# Patient Record
Sex: Male | Born: 1955 | Hispanic: No | Marital: Married | Smoking: Former smoker
Health system: Southern US, Community
[De-identification: ages and names within clinical notes are randomized; demographics above are authoritative.]

## PROBLEM LIST (undated history)

## (undated) DIAGNOSIS — G4730 Sleep apnea, unspecified: Secondary | ICD-10-CM

## (undated) DIAGNOSIS — M1990 Unspecified osteoarthritis, unspecified site: Secondary | ICD-10-CM

## (undated) DIAGNOSIS — J189 Pneumonia, unspecified organism: Secondary | ICD-10-CM

## (undated) DIAGNOSIS — J449 Chronic obstructive pulmonary disease, unspecified: Secondary | ICD-10-CM

---

## 2012-05-27 ENCOUNTER — Ambulatory Visit: Payer: Self-pay | Admitting: Specialist

## 2012-08-05 ENCOUNTER — Ambulatory Visit: Payer: Self-pay | Admitting: Specialist

## 2018-03-23 ENCOUNTER — Other Ambulatory Visit: Payer: Self-pay | Admitting: Orthopedic Surgery

## 2018-03-23 DIAGNOSIS — M545 Low back pain, unspecified: Secondary | ICD-10-CM

## 2018-04-05 ENCOUNTER — Ambulatory Visit
Admission: RE | Admit: 2018-04-05 | Discharge: 2018-04-05 | Disposition: A | Discharge status: Home / Self Care | Payer: BLUE CROSS/BLUE SHIELD | Source: Ambulatory Visit | Attending: Orthopedic Surgery | Admitting: Orthopedic Surgery

## 2018-04-05 DIAGNOSIS — M545 Low back pain, unspecified: Secondary | ICD-10-CM

## 2018-10-11 ENCOUNTER — Ambulatory Visit: Payer: Self-pay | Admitting: Orthopedic Surgery

## 2018-10-18 ENCOUNTER — Ambulatory Visit: Payer: Self-pay | Admitting: Orthopedic Surgery

## 2018-10-18 DIAGNOSIS — M12551 Traumatic arthropathy, right hip: Secondary | ICD-10-CM

## 2018-10-18 NOTE — H&P (Signed)
TOTAL HIP ADMISSION H&P  Patient is admitted for right total hip arthroplasty.  Subjective:  Chief Complaint: right hip pain  HPI: Brandon Kaufman, 63 y.o. male, has a history of pain and functional disability in the right hip(s) due to arthritis and patient has failed non-surgical conservative treatments for greater than 12 weeks to include NSAID's and/or analgesics, flexibility and strengthening excercises, use of assistive devices, weight reduction as appropriate and activity modification.  Onset of symptoms was gradual starting 5 years ago with gradually worsening course since that time.The patient noted prior procedures of the hip to include traction for proximal femur fracture on the right hip(s).  Patient currently rates pain in the right hip at 10 out of 10 with activity. Patient has night pain, worsening of pain with activity and weight bearing, trendelenberg gait, pain that interfers with activities of daily living, pain with passive range of motion, crepitus and joint swelling. Patient has evidence of subchondral cysts, subchondral sclerosis, periarticular osteophytes and joint space narrowing by imaging studies. This condition presents safety issues increasing the risk of falls. This patient has had proximal femur fracture.  There is no current active infection.  There are no active problems to display for this patient.  Past Medical History:  Diagnosis Date  . Arthritis   . COPD (chronic obstructive pulmonary disease) (HCC)   . Pneumonia   . Sleep apnea    cpap    Past Surgical History:  Procedure Laterality Date  . COLONOSCOPY      No current facility-administered medications for this visit.    No current outpatient medications on file.   Facility-Administered Medications Ordered in Other Visits  Medication Dose Route Frequency Provider Last Rate Last Dose  . 0.9 %  sodium chloride infusion   Intravenous Continuous Athea Haley, MD      . acetaminophen (OFIRMEV) IV 1,000  mg  1,000 mg Intravenous To OR Lluvia Gwynne, MD      . ceFAZolin (ANCEF) IVPB 2g/100 mL premix  2 g Intravenous On Call to OR Lucresha Dismuke, MD      . chlorhexidine (HIBICLENS) 4 % liquid 4 application  60 mL Topical Once Wisdom Seybold, MD      . chlorhexidine (HIBICLENS) 4 % liquid 4 application  60 mL Topical Once Damichael Hofman, MD      . lactated ringers infusion   Intravenous Continuous Ayden Apodaca, MD 10 mL/hr at 10/27/18 0607    . povidone-iodine 10 % swab 2 application  2 application Topical Once Shifra Swartzentruber, MD      . povidone-iodine 10 % swab 2 application  2 application Topical Once Yaire Kreher, MD      . tranexamic acid (CYKLOKAPRON) IVPB 1,000 mg  1,000 mg Intravenous To OR Minnie Legros, MD       No Known Allergies  Social History   Tobacco Use  . Smoking status: Former Smoker    Types: Cigarettes    Quit date: 10/24/2011    Years since quitting: 7.0  . Smokeless tobacco: Never Used  Substance Use Topics  . Alcohol use: Yes    Alcohol/week: 1.0 standard drinks    Types: 1 Cans of beer per week    Comment: can a day    No family history on file.   Review of Systems  Constitutional: Negative.   HENT: Negative.   Eyes: Negative.   Respiratory: Negative.   Cardiovascular: Negative.   Gastrointestinal: Negative.   Genitourinary: Negative.   Musculoskeletal: Positive for joint   pain.  Skin: Negative.   Neurological: Negative.   Endo/Heme/Allergies: Negative.   Psychiatric/Behavioral: Negative.     Objective:  Physical Exam  Vitals reviewed. Constitutional: He is oriented to person, place, and time. He appears well-developed and well-nourished.  HENT:  Head: Normocephalic and atraumatic.  Eyes: Pupils are equal, round, and reactive to light. Conjunctivae and EOM are normal.  Neck: Normal range of motion. Neck supple.  Cardiovascular: Normal rate, regular rhythm and intact distal pulses.  Respiratory: Effort normal. No respiratory distress.   GI: Soft. He exhibits no distension.  Genitourinary:    Genitourinary Comments: Deferred   Musculoskeletal:     Right hip: He exhibits decreased range of motion, decreased strength and bony tenderness.  Neurological: He is alert and oriented to person, place, and time. He has normal reflexes.  Skin: Skin is warm and dry.  Psychiatric: He has a normal mood and affect. His behavior is normal. Judgment and thought content normal.    Vital signs in last 24 hours: @VSRANGES @  Labs:   Estimated body mass index is 29.97 kg/m as calculated from the following:   Height as of 10/27/18: 6\' 4"  (1.93 m).   Weight as of 10/27/18: 111.7 kg.   Imaging Review Plain radiographs demonstrate severe degenerative joint disease of the right hip(s). The bone quality appears to be adequate for age and reported activity level.      Assessment/Plan:  End stage arthritis, right hip(s)  The patient history, physical examination, clinical judgement of the provider and imaging studies are consistent with end stage degenerative joint disease of the right hip(s) and total hip arthroplasty is deemed medically necessary. The treatment options including medical management, injection therapy, arthroscopy and arthroplasty were discussed at length. The risks and benefits of total hip arthroplasty were presented and reviewed. The risks due to aseptic loosening, infection, stiffness, dislocation/subluxation,  thromboembolic complications and other imponderables were discussed.  The patient acknowledged the explanation, agreed to proceed with the plan and consent was signed. Patient is being admitted for inpatient treatment for surgery, pain control, PT, OT, prophylactic antibiotics, VTE prophylaxis, progressive ambulation and ADL's and discharge planning.The patient is planning to be discharged home with HEP   Anticipated LOS equal to or greater than 2 midnights due to - Age 34 and older with one or more of the  following:  - Obesity  - Expected need for hospital services (PT, OT, Nursing) required for safe  discharge  - Anticipated need for postoperative skilled nursing care or inpatient rehab  - Active co-morbidities: Respiratory Failure/COPD OR   - Unanticipated findings during/Post Surgery: None  - Patient is a high risk of re-admission due to: None

## 2018-10-18 NOTE — H&P (View-Only) (Signed)
TOTAL HIP ADMISSION H&P  Patient is admitted for right total hip arthroplasty.  Subjective:  Chief Complaint: right hip pain  HPI: Brandon Kaufman, 63 y.o. male, has a history of pain and functional disability in the right hip(s) due to arthritis and patient has failed non-surgical conservative treatments for greater than 12 weeks to include NSAID's and/or analgesics, flexibility and strengthening excercises, use of assistive devices, weight reduction as appropriate and activity modification.  Onset of symptoms was gradual starting 5 years ago with gradually worsening course since that time.The patient noted prior procedures of the hip to include traction for proximal femur fracture on the right hip(s).  Patient currently rates pain in the right hip at 10 out of 10 with activity. Patient has night pain, worsening of pain with activity and weight bearing, trendelenberg gait, pain that interfers with activities of daily living, pain with passive range of motion, crepitus and joint swelling. Patient has evidence of subchondral cysts, subchondral sclerosis, periarticular osteophytes and joint space narrowing by imaging studies. This condition presents safety issues increasing the risk of falls. This patient has had proximal femur fracture.  There is no current active infection.  There are no active problems to display for this patient.  Past Medical History:  Diagnosis Date  . Arthritis   . COPD (chronic obstructive pulmonary disease) (HCC)   . Pneumonia   . Sleep apnea    cpap    Past Surgical History:  Procedure Laterality Date  . COLONOSCOPY      No current facility-administered medications for this visit.    No current outpatient medications on file.   Facility-Administered Medications Ordered in Other Visits  Medication Dose Route Frequency Provider Last Rate Last Dose  . 0.9 %  sodium chloride infusion   Intravenous Continuous Jessa Stinson, Arlys JohnBrian, MD      . acetaminophen (OFIRMEV) IV 1,000  mg  1,000 mg Intravenous To OR Adriyana Greenbaum, Arlys JohnBrian, MD      . ceFAZolin (ANCEF) IVPB 2g/100 mL premix  2 g Intravenous On Call to OR Lalaine Overstreet, Arlys JohnBrian, MD      . chlorhexidine (HIBICLENS) 4 % liquid 4 application  60 mL Topical Once Earlyne Feeser, Arlys JohnBrian, MD      . chlorhexidine (HIBICLENS) 4 % liquid 4 application  60 mL Topical Once Samson FredericSwinteck, Brion Hedges, MD      . lactated ringers infusion   Intravenous Continuous Samson FredericSwinteck, Shayle Donahoo, MD 10 mL/hr at 10/27/18 [phone]    . povidone-iodine 10 % swab 2 application  2 application Topical Once Arlind Klingerman, Arlys JohnBrian, MD      . povidone-iodine 10 % swab 2 application  2 application Topical Once Patty Leitzke, Arlys JohnBrian, MD      . tranexamic acid (CYKLOKAPRON) IVPB 1,000 mg  1,000 mg Intravenous To OR Mayfield Schoene, Arlys JohnBrian, MD       No Known Allergies  Social History   Tobacco Use  . Smoking status: Former Smoker    Types: Cigarettes    Quit date: 10/24/2011    Years since quitting: 7.0  . Smokeless tobacco: Never Used  Substance Use Topics  . Alcohol use: Yes    Alcohol/week: 1.0 standard drinks    Types: 1 Cans of beer per week    Comment: can a day    No family history on file.   Review of Systems  Constitutional: Negative.   HENT: Negative.   Eyes: Negative.   Respiratory: Negative.   Cardiovascular: Negative.   Gastrointestinal: Negative.   Genitourinary: Negative.   Musculoskeletal: Positive for joint  pain.  Skin: Negative.   Neurological: Negative.   Endo/Heme/Allergies: Negative.   Psychiatric/Behavioral: Negative.     Objective:  Physical Exam  Vitals reviewed. Constitutional: He is oriented to person, place, and time. He appears well-developed and well-nourished.  HENT:  Head: Normocephalic and atraumatic.  Eyes: Pupils are equal, round, and reactive to light. Conjunctivae and EOM are normal.  Neck: Normal range of motion. Neck supple.  Cardiovascular: Normal rate, regular rhythm and intact distal pulses.  Respiratory: Effort normal. No respiratory distress.   GI: Soft. He exhibits no distension.  Genitourinary:    Genitourinary Comments: Deferred   Musculoskeletal:     Right hip: He exhibits decreased range of motion, decreased strength and bony tenderness.  Neurological: He is alert and oriented to person, place, and time. He has normal reflexes.  Skin: Skin is warm and dry.  Psychiatric: He has a normal mood and affect. His behavior is normal. Judgment and thought content normal.    Vital signs in last 24 hours: @VSRANGES @  Labs:   Estimated body mass index is 29.97 kg/m as calculated from the following:   Height as of 10/27/18: 6\' 4"  (1.93 m).   Weight as of 10/27/18: 111.7 kg.   Imaging Review Plain radiographs demonstrate severe degenerative joint disease of the right hip(s). The bone quality appears to be adequate for age and reported activity level.      Assessment/Plan:  End stage arthritis, right hip(s)  The patient history, physical examination, clinical judgement of the provider and imaging studies are consistent with end stage degenerative joint disease of the right hip(s) and total hip arthroplasty is deemed medically necessary. The treatment options including medical management, injection therapy, arthroscopy and arthroplasty were discussed at length. The risks and benefits of total hip arthroplasty were presented and reviewed. The risks due to aseptic loosening, infection, stiffness, dislocation/subluxation,  thromboembolic complications and other imponderables were discussed.  The patient acknowledged the explanation, agreed to proceed with the plan and consent was signed. Patient is being admitted for inpatient treatment for surgery, pain control, PT, OT, prophylactic antibiotics, VTE prophylaxis, progressive ambulation and ADL's and discharge planning.The patient is planning to be discharged home with HEP   Anticipated LOS equal to or greater than 2 midnights due to - Age 34 and older with one or more of the  following:  - Obesity  - Expected need for hospital services (PT, OT, Nursing) required for safe  discharge  - Anticipated need for postoperative skilled nursing care or inpatient rehab  - Active co-morbidities: Respiratory Failure/COPD OR   - Unanticipated findings during/Post Surgery: None  - Patient is a high risk of re-admission due to: None

## 2018-10-20 NOTE — Patient Instructions (Addendum)
YOU NEED TO HAVE A COVID 19 TEST ON_______ @_______ , THIS TEST MUST BE DONE BEFORE SURGERY, COME TO [place] ENTRANCE. ONCE YOUR COVID TEST IS COMPLETED, PLEASE BEGIN THE QUARANTINE INSTRUCTIONS AS OUTLINED IN YOUR HANDOUT.                Brandon Kaufman    Your procedure is scheduled on: 10-27-2018  Report to [facility] Main  Entrance  Report to [place] at 530 AM      Call this number if you have problems the morning of surgery [phone]    Remember: [place], NO [place].   NO SOLID FOOD AFTER MIDNIGHT THE NIGHT PRIOR TO SURGERY. NOTHING BY MOUTH EXCEPT CLEAR LIQUIDS UNTIL 430 AM.  PLEASE FINISH ENSURE DRINK PER SURGEON ORDER 3 HOURS PRIOR TO SCHEDULED SURGERY TIME WHICH NEEDS TO BE COMPLETED AT 430 AM.   CLEAR LIQUID DIET   Foods Allowed                                                                     Foods Excluded  Coffee and tea, regular and decaf                             liquids that you cannot  Plain Jell-O in any flavor                                             see through such as: Fruit ices (not with fruit pulp)                                     milk, soups, orange juice  Iced Popsicles                                    All solid food Carbonated beverages, regular and diet                                    Cranberry, grape and apple juices Sports drinks like Gatorade Lightly seasoned clear broth or consume(fat free) Sugar, honey syrup  Sample Menu Breakfast                                Lunch                                     Supper Cranberry juice                    Beef broth  Chicken broth Jell-O                                     Grape juice                           Apple juice Coffee or tea                        Jell-O                                      Popsicle    Coffee or tea                        Coffee or tea  _____________________________________________________________________     Take these medicines the morning of surgery with A SIP OF WATER: inhaler and bring with you lipitor, claritin                                You may not have any metal on your body including hair pins and              piercings  Do not wear jewelry,lotions, powders or perfumes, deodorant                        Men may shave face and neck.   Do not bring valuables to the hospital. [facility] IS NOT             RESPONSIBLE   FOR VALUABLES.  Contacts, dentures or bridgework may not be worn into surgery.      _____________________________________________________________________             [facility] Before surgery, you can play an important role.  Because skin is not sterile, your skin needs to be as free of germs as possible.  You can reduce the number of germs on your skin by washing with CHG (chlorahexidine gluconate) soap before surgery.  CHG is an antiseptic cleaner which kills germs and bonds with the skin to continue killing germs even after washing. Please DO NOT use if you have an allergy to CHG or antibacterial soaps.  If your skin becomes reddened/irritated stop using the CHG and inform your nurse when you arrive at Short Stay. Do not shave (including legs and underarms) for at least 48 hours prior to the first CHG shower.  You may shave your face/neck. Please follow these instructions carefully:  1.  Shower with CHG Soap the night before surgery and the  morning of Surgery.  2.  If you choose to wash your hair, wash your hair first as usual with your  normal  shampoo.  3.  After you shampoo, rinse your hair and body thoroughly to remove the  shampoo.                           4.  Use CHG as you would any other liquid soap.  You can apply chg directly  to the skin and wash  Gently with a scrungie or clean  washcloth.  5.  Apply the CHG Soap to your body ONLY FROM THE NECK DOWN.   Do not use on face/ open                           Wound or open sores. Avoid contact with eyes, ears mouth and genitals (private parts).                       Wash face,  Genitals (private parts) with your normal soap.             6.  Wash thoroughly, paying special attention to the area where your surgery  will be performed.  7.  Thoroughly rinse your body with warm water from the neck down.  8.  DO NOT shower/wash with your normal soap after using and rinsing off  the CHG Soap.                9.  Pat yourself dry with a clean towel.            10.  Wear clean pajamas.            11.  Place clean sheets on your bed the night of your first shower and do not  sleep with pets. Day of Surgery : Do not apply any lotions/deodorants the morning of surgery.  Please wear clean clothes to the hospital/surgery center.  FAILURE TO FOLLOW THESE INSTRUCTIONS MAY RESULT IN THE CANCELLATION OF YOUR SURGERY PATIENT SIGNATURE_________________________________  NURSE SIGNATURE__________________________________  ________________________________________________________________________   [name]  An incentive spirometer is a tool that can help keep your lungs clear and active. This tool measures how well you are filling your lungs with each breath. Taking long deep breaths may help reverse or decrease the chance of developing breathing (pulmonary) problems (especially infection) following:  A long period of time when you are unable to move or be active. BEFORE THE PROCEDURE   If the spirometer includes an indicator to show your best effort, your nurse or respiratory therapist will set it to a desired goal.  If possible, sit up straight or lean slightly forward. Try not to slouch.  Hold the incentive spirometer in an upright position. INSTRUCTIONS FOR USE  1. Sit on the edge of your bed if possible, or sit up as far  as you can in bed or on a chair. 2. Hold the incentive spirometer in an upright position. 3. Breathe out normally. 4. Place the mouthpiece in your mouth and seal your lips tightly around it. 5. Breathe in slowly and as deeply as possible, raising the piston or the ball toward the top of the column. 6. Hold your breath for 3-5 seconds or for as long as possible. Allow the piston or ball to fall to the bottom of the column. 7. Remove the mouthpiece from your mouth and breathe out normally. 8. Rest for a few seconds and repeat Steps 1 through 7 at least 10 times every 1-2 hours when you are awake. Take your time and take a few normal breaths between deep breaths. 9. The spirometer may include an indicator to show your best effort. Use the indicator as a goal to work toward during each repetition. 10. After each set of 10 deep breaths, practice coughing to be sure your lungs are clear. If you have an incision (the cut made at the time of  surgery), support your incision when coughing by placing a pillow or rolled up towels firmly against it. Once you are able to get out of bed, walk around indoors and cough well. You may stop using the incentive spirometer when instructed by your caregiver.  RISKS AND COMPLICATIONS  Take your time so you do not get dizzy or light-headed.  If you are in pain, you may need to take or ask for pain medication before doing incentive spirometry. It is harder to take a deep breath if you are having pain. AFTER USE  Rest and breathe slowly and easily.  It can be helpful to keep track of a log of your progress. Your caregiver can provide you with a simple table to help with this. If you are using the spirometer at home, follow these instructions: [place] IF:   You are having difficultly using the spirometer.  You have trouble using the spirometer as often as instructed.  Your pain medication is not giving enough relief while using the spirometer.  You  develop fever of 100.5 F (38.1 C) or higher. SEEK IMMEDIATE MEDICAL CARE IF:   You cough up bloody sputum that had not been present before.  You develop fever of 102 F (38.9 C) or greater.  You develop worsening pain at or near the incision site. MAKE SURE YOU:   Understand these instructions.  Will watch your condition.  Will get help right away if you are not doing well or get worse. Document Released: 08/17/2006 Document Revised: 06/29/2011 Document Reviewed: 10/18/2006 ExitCare Patient Information 2014 Exit[employer], [place].   ________________________________________________________________________  WHAT IS A BLOOD TRANSFUSION? Blood Transfusion Information  A transfusion is the replacement of blood or some of its parts. Blood is made up of multiple cells which provide different functions.  Red blood cells carry oxygen and are used for blood loss replacement.  White blood cells fight against infection.  Platelets control bleeding.  Plasma helps clot blood.  Other blood products are available for specialized needs, such as hemophilia or other clotting disorders. BEFORE THE TRANSFUSION  Who gives blood for transfusions?   Healthy volunteers who are fully evaluated to make sure their blood is safe. This is blood bank blood. Transfusion therapy is the safest it has ever been in the practice of medicine. Before blood is taken from a donor, a complete history is taken to make sure that person has no history of diseases nor engages in risky social behavior (examples are intravenous drug use or sexual activity with multiple partners). The donor's travel history is screened to minimize risk of transmitting infections, such as malaria. The donated blood is tested for signs of infectious diseases, such as HIV and hepatitis. The blood is then tested to be sure it is compatible with you in order to minimize the chance of a transfusion reaction. If you or a relative donates blood, this is  often done in anticipation of surgery and is not appropriate for emergency situations. It takes many days to process the donated blood. RISKS AND COMPLICATIONS Although transfusion therapy is very safe and saves many lives, the main dangers of transfusion include:   Getting an infectious disease.  Developing a transfusion reaction. This is an allergic reaction to something in the blood you were given. Every precaution is taken to prevent this. The decision to have a blood transfusion has been considered carefully by your caregiver before blood is given. Blood is not given unless the benefits outweigh the risks. AFTER THE  TRANSFUSION  Right after receiving a blood transfusion, you will usually feel much better and more energetic. This is especially true if your red blood cells have gotten low (anemic). The transfusion raises the level of the red blood cells which carry oxygen, and this usually causes an energy increase.  The nurse administering the transfusion will monitor you carefully for complications. HOME CARE INSTRUCTIONS  No special instructions are needed after a transfusion. You may find your energy is better. Speak with your caregiver about any limitations on activity for underlying diseases you may have. SEEK MEDICAL CARE IF:   Your condition is not improving after your transfusion.  You develop redness or irritation at the intravenous (IV) site. SEEK IMMEDIATE MEDICAL CARE IF:  Any of the following symptoms occur over the next 12 hours:  Shaking chills.  You have a temperature by mouth above 102 F (38.9 C), not controlled by medicine.  Chest, back, or muscle pain.  People around you feel you are not acting correctly or are confused.  Shortness of breath or difficulty breathing.  Dizziness and fainting.  You get a rash or develop hives.  You have a decrease in urine output.  Your urine turns a dark color or changes to pink, red, or brown. Any of the following  symptoms occur over the next 10 days:  You have a temperature by mouth above 102 F (38.9 C), not controlled by medicine.  Shortness of breath.  Weakness after normal activity.  The white part of the eye turns yellow (jaundice).  You have a decrease in the amount of urine or are urinating less often.  Your urine turns a dark color or changes to pink, red, or brown. Document Released: 04/03/2000 Document Revised: 06/29/2011 Document Reviewed: 11/21/2007 [facility] Patient Information 2014 [place], [place].  _______________________________________________________________________

## 2018-10-24 ENCOUNTER — Encounter (HOSPITAL_COMMUNITY)
Admission: RE | Admit: 2018-10-24 | Discharge: 2018-10-24 | Disposition: A | Discharge status: Home / Self Care | Payer: BC Managed Care – PPO | Source: Ambulatory Visit | Attending: Orthopedic Surgery | Admitting: Orthopedic Surgery

## 2018-10-24 ENCOUNTER — Other Ambulatory Visit (HOSPITAL_COMMUNITY)
Admission: RE | Admit: 2018-10-24 | Discharge: 2018-10-24 | Disposition: A | Discharge status: Home / Self Care | Payer: BC Managed Care – PPO | Source: Ambulatory Visit | Attending: Orthopedic Surgery | Admitting: Orthopedic Surgery

## 2018-10-24 ENCOUNTER — Other Ambulatory Visit: Payer: Self-pay

## 2018-10-24 ENCOUNTER — Encounter (HOSPITAL_COMMUNITY): Payer: Self-pay

## 2018-10-24 DIAGNOSIS — Z01812 Encounter for preprocedural laboratory examination: Secondary | ICD-10-CM | POA: Diagnosis present

## 2018-10-24 DIAGNOSIS — M1611 Unilateral primary osteoarthritis, right hip: Secondary | ICD-10-CM | POA: Insufficient documentation

## 2018-10-24 DIAGNOSIS — Z1159 Encounter for screening for other viral diseases: Secondary | ICD-10-CM | POA: Insufficient documentation

## 2018-10-24 HISTORY — DX: Chronic obstructive pulmonary disease, unspecified: J44.9

## 2018-10-24 HISTORY — DX: Pneumonia, unspecified organism: J18.9

## 2018-10-24 HISTORY — DX: Unspecified osteoarthritis, unspecified site: M19.90

## 2018-10-24 HISTORY — DX: Sleep apnea, unspecified: G47.30

## 2018-10-24 LAB — URINALYSIS, ROUTINE W REFLEX MICROSCOPIC
Bilirubin Urine: NEGATIVE
Glucose, UA: NEGATIVE mg/dL
Hgb urine dipstick: NEGATIVE
Ketones, ur: NEGATIVE mg/dL
Leukocytes,Ua: NEGATIVE
Nitrite: NEGATIVE
Protein, ur: NEGATIVE mg/dL
Specific Gravity, Urine: 1.014 (ref 1.005–1.030)
pH: 7 (ref 5.0–8.0)

## 2018-10-24 LAB — CBC
HCT: 47 % (ref 39.0–52.0)
Hemoglobin: 15.5 g/dL (ref 13.0–17.0)
MCH: 31.4 pg (ref 26.0–34.0)
MCHC: 33 g/dL (ref 30.0–36.0)
MCV: 95.1 fL (ref 80.0–100.0)
Platelets: 148 10*3/uL — ABNORMAL LOW (ref 150–400)
RBC: 4.94 MIL/uL (ref 4.22–5.81)
RDW: 12.7 % (ref 11.5–15.5)
WBC: 5.4 10*3/uL (ref 4.0–10.5)
nRBC: 0 % (ref 0.0–0.2)

## 2018-10-24 LAB — PROTIME-INR
INR: 1 (ref 0.8–1.2)
Prothrombin Time: 13.1 seconds (ref 11.4–15.2)

## 2018-10-24 LAB — COMPREHENSIVE METABOLIC PANEL
ALT: 38 U/L (ref 0–44)
AST: 28 U/L (ref 15–41)
Albumin: 4.4 g/dL (ref 3.5–5.0)
Alkaline Phosphatase: 56 U/L (ref 38–126)
Anion gap: 10 (ref 5–15)
BUN: 11 mg/dL (ref 8–23)
CO2: 24 mmol/L (ref 22–32)
Calcium: 9 mg/dL (ref 8.9–10.3)
Chloride: 105 mmol/L (ref 98–111)
Creatinine, Ser: 0.78 mg/dL (ref 0.61–1.24)
GFR calc Af Amer: >60 mL/min (ref >60)
GFR calc non Af Amer: >60 mL/min (ref >60)
Glucose, Bld: 99 mg/dL (ref 70–99)
Potassium: 4 mmol/L (ref 3.5–5.1)
Sodium: 139 mmol/L (ref 135–145)
Total Bilirubin: 1 mg/dL (ref 0.3–1.2)
Total Protein: 7.2 g/dL (ref 6.5–8.1)

## 2018-10-24 LAB — ABO/RH: ABO/RH(D): O POS

## 2018-10-24 LAB — SURGICAL PCR SCREEN
MRSA, PCR: NEGATIVE
Staphylococcus aureus: NEGATIVE

## 2018-10-24 LAB — APTT: aPTT: 27 seconds (ref 24–36)

## 2018-10-24 NOTE — Progress Notes (Deleted)
LOV Dr. Melvyn Novas 02-19-17 pulmonary  Pulm Function test 02-07-17   cxr 02-07-18

## 2018-10-25 LAB — SARS CORONAVIRUS 2 (TAT 6-24 HRS): SARS Coronavirus 2: NEGATIVE

## 2018-10-27 ENCOUNTER — Observation Stay (HOSPITAL_COMMUNITY): Payer: BC Managed Care – PPO

## 2018-10-27 ENCOUNTER — Encounter (HOSPITAL_COMMUNITY): Payer: Self-pay

## 2018-10-27 ENCOUNTER — Ambulatory Visit (HOSPITAL_COMMUNITY): Payer: BC Managed Care – PPO

## 2018-10-27 ENCOUNTER — Observation Stay (HOSPITAL_COMMUNITY)
Admission: RE | Admit: 2018-10-27 | Discharge: 2018-10-28 | Disposition: A | Discharge status: Home / Self Care | Payer: BC Managed Care – PPO | Source: Other Acute Inpatient Hospital | Attending: Orthopedic Surgery | Admitting: Orthopedic Surgery

## 2018-10-27 ENCOUNTER — Encounter (HOSPITAL_COMMUNITY)
Admission: RE | Disposition: A | Discharge status: Home / Self Care | Payer: Self-pay | Source: Other Acute Inpatient Hospital | Attending: Orthopedic Surgery

## 2018-10-27 ENCOUNTER — Other Ambulatory Visit: Payer: Self-pay

## 2018-10-27 ENCOUNTER — Ambulatory Visit (HOSPITAL_COMMUNITY): Payer: BC Managed Care – PPO | Admitting: Certified Registered"

## 2018-10-27 DIAGNOSIS — Z79899 Other long term (current) drug therapy: Secondary | ICD-10-CM | POA: Diagnosis not present

## 2018-10-27 DIAGNOSIS — R269 Unspecified abnormalities of gait and mobility: Secondary | ICD-10-CM | POA: Insufficient documentation

## 2018-10-27 DIAGNOSIS — J449 Chronic obstructive pulmonary disease, unspecified: Secondary | ICD-10-CM | POA: Insufficient documentation

## 2018-10-27 DIAGNOSIS — G4730 Sleep apnea, unspecified: Secondary | ICD-10-CM | POA: Insufficient documentation

## 2018-10-27 DIAGNOSIS — E669 Obesity, unspecified: Secondary | ICD-10-CM | POA: Insufficient documentation

## 2018-10-27 DIAGNOSIS — M12551 Traumatic arthropathy, right hip: Secondary | ICD-10-CM | POA: Diagnosis not present

## 2018-10-27 DIAGNOSIS — Z87891 Personal history of nicotine dependence: Secondary | ICD-10-CM | POA: Insufficient documentation

## 2018-10-27 DIAGNOSIS — Z6829 Body mass index (BMI) 29.0-29.9, adult: Secondary | ICD-10-CM | POA: Diagnosis not present

## 2018-10-27 DIAGNOSIS — Z09 Encounter for follow-up examination after completed treatment for conditions other than malignant neoplasm: Secondary | ICD-10-CM

## 2018-10-27 DIAGNOSIS — Z419 Encounter for procedure for purposes other than remedying health state, unspecified: Secondary | ICD-10-CM

## 2018-10-27 HISTORY — PX: TOTAL HIP ARTHROPLASTY: SHX124

## 2018-10-27 LAB — TYPE AND SCREEN
ABO/RH(D): O POS
Antibody Screen: NEGATIVE

## 2018-10-27 SURGERY — ARTHROPLASTY, HIP, TOTAL, ANTERIOR APPROACH
Anesthesia: Spinal | Site: Hip | Laterality: Right

## 2018-10-27 MED ORDER — ALBUTEROL SULFATE (2.5 MG/3ML) 0.083% IN NEBU: 2.5000 mg | INHALATION_SOLUTION | Freq: Four times a day (QID) | RESPIRATORY_TRACT | Status: DC | PRN

## 2018-10-27 MED ORDER — TRANEXAMIC ACID-NACL 1000-0.7 MG/100ML-% IV SOLN
1000.0000 mg | INTRAVENOUS | Status: AC
  Administered 2018-10-27: 08:00:00 1000 mg via INTRAVENOUS
  Filled 2018-10-27: qty 100

## 2018-10-27 MED ORDER — LORATADINE 10 MG PO TABS
10.0000 mg | ORAL_TABLET | Freq: Every day | ORAL | Status: DC
  Administered 2018-10-28: 10 mg via ORAL
  Filled 2018-10-27: qty 1

## 2018-10-27 MED ORDER — LATANOPROST 0.005 % OP SOLN
1.0000 [drp] | Freq: Every day | OPHTHALMIC | Status: DC
  Filled 2018-10-27: qty 2.5

## 2018-10-27 MED ORDER — PROPOFOL 10 MG/ML IV BOLUS
INTRAVENOUS | Status: AC
  Filled 2018-10-27: qty 20

## 2018-10-27 MED ORDER — CHLORHEXIDINE GLUCONATE 4 % EX LIQD: 60.0000 mL | Freq: Once | CUTANEOUS | Status: DC

## 2018-10-27 MED ORDER — DOCUSATE SODIUM 100 MG PO CAPS
100.0000 mg | ORAL_CAPSULE | Freq: Two times a day (BID) | ORAL | Status: DC
  Administered 2018-10-27 – 2018-10-28 (×2): 100 mg via ORAL
  Filled 2018-10-27 (×2): qty 1

## 2018-10-27 MED ORDER — BUPIVACAINE-EPINEPHRINE 0.25% -1:200000 IJ SOLN
INTRAMUSCULAR | Status: DC | PRN
  Administered 2018-10-27: 30 mL

## 2018-10-27 MED ORDER — ATORVASTATIN CALCIUM 40 MG PO TABS
40.0000 mg | ORAL_TABLET | Freq: Every day | ORAL | Status: DC
  Administered 2018-10-27: 40 mg via ORAL
  Filled 2018-10-27: qty 1

## 2018-10-27 MED ORDER — SODIUM CHLORIDE 0.9 % IV SOLN
INTRAVENOUS | Status: DC
  Administered 2018-10-27: 16:00:00 via INTRAVENOUS

## 2018-10-27 MED ORDER — GLYCOPYRROLATE PF 0.2 MG/ML IJ SOSY
PREFILLED_SYRINGE | INTRAMUSCULAR | Status: AC
  Filled 2018-10-27: qty 2

## 2018-10-27 MED ORDER — HYDROCODONE-ACETAMINOPHEN 5-325 MG PO TABS
ORAL_TABLET | ORAL | Status: AC
  Filled 2018-10-27: qty 1

## 2018-10-27 MED ORDER — CELECOXIB 200 MG PO CAPS
200.0000 mg | ORAL_CAPSULE | Freq: Two times a day (BID) | ORAL | Status: DC
  Administered 2018-10-27 – 2018-10-28 (×2): 200 mg via ORAL
  Filled 2018-10-27 (×2): qty 1

## 2018-10-27 MED ORDER — PROMETHAZINE HCL 25 MG/ML IJ SOLN: 6.2500 mg | INTRAMUSCULAR | Status: DC | PRN

## 2018-10-27 MED ORDER — SODIUM CHLORIDE 0.9 % IR SOLN
Status: DC | PRN
  Administered 2018-10-27: 1000 mL
  Administered 2018-10-27: 3000 mL

## 2018-10-27 MED ORDER — ISOPROPYL ALCOHOL 70 % SOLN
Status: DC | PRN
  Administered 2018-10-27: 1 via TOPICAL

## 2018-10-27 MED ORDER — HYDROCODONE-ACETAMINOPHEN 7.5-325 MG PO TABS: 1.0000 | ORAL_TABLET | ORAL | Status: DC | PRN

## 2018-10-27 MED ORDER — POVIDONE-IODINE 10 % EX SWAB: 2.0000 "application " | Freq: Once | CUTANEOUS | Status: DC

## 2018-10-27 MED ORDER — PHENOL 1.4 % MT LIQD: 1.0000 | OROMUCOSAL | Status: DC | PRN

## 2018-10-27 MED ORDER — KETOROLAC TROMETHAMINE 30 MG/ML IJ SOLN
INTRAMUSCULAR | Status: DC | PRN
  Administered 2018-10-27: 30 mg via INTRAVENOUS

## 2018-10-27 MED ORDER — MORPHINE SULFATE (PF) 2 MG/ML IV SOLN: 0.5000 mg | INTRAVENOUS | Status: DC | PRN

## 2018-10-27 MED ORDER — PROPOFOL 10 MG/ML IV BOLUS
INTRAVENOUS | Status: DC | PRN
  Administered 2018-10-27: 10 mg via INTRAVENOUS

## 2018-10-27 MED ORDER — KETOROLAC TROMETHAMINE 30 MG/ML IJ SOLN
INTRAMUSCULAR | Status: AC
  Filled 2018-10-27: qty 1

## 2018-10-27 MED ORDER — PROPOFOL 500 MG/50ML IV EMUL
INTRAVENOUS | Status: DC | PRN
  Administered 2018-10-27: 100 ug/kg/min via INTRAVENOUS

## 2018-10-27 MED ORDER — PHENYLEPHRINE HCL (PRESSORS) 10 MG/ML IV SOLN
INTRAVENOUS | Status: AC
  Filled 2018-10-27: qty 5

## 2018-10-27 MED ORDER — MIDAZOLAM HCL 2 MG/2ML IJ SOLN
INTRAMUSCULAR | Status: AC
  Filled 2018-10-27: qty 2

## 2018-10-27 MED ORDER — METHOCARBAMOL 500 MG PO TABS
500.0000 mg | ORAL_TABLET | Freq: Four times a day (QID) | ORAL | Status: DC | PRN
  Administered 2018-10-27 (×2): 500 mg via ORAL
  Filled 2018-10-27: qty 1

## 2018-10-27 MED ORDER — ACETAMINOPHEN 10 MG/ML IV SOLN
1000.0000 mg | INTRAVENOUS | Status: AC
  Administered 2018-10-27: 10:00:00 1000 mg via INTRAVENOUS
  Filled 2018-10-27: qty 100

## 2018-10-27 MED ORDER — DEXAMETHASONE SODIUM PHOSPHATE 10 MG/ML IJ SOLN
INTRAMUSCULAR | Status: AC
  Filled 2018-10-27: qty 1

## 2018-10-27 MED ORDER — METOCLOPRAMIDE HCL 5 MG/ML IJ SOLN: 5.0000 mg | Freq: Three times a day (TID) | INTRAMUSCULAR | Status: DC | PRN

## 2018-10-27 MED ORDER — PROPOFOL 10 MG/ML IV BOLUS
INTRAVENOUS | Status: AC
  Filled 2018-10-27: qty 80

## 2018-10-27 MED ORDER — CEFAZOLIN SODIUM-DEXTROSE 2-4 GM/100ML-% IV SOLN
2.0000 g | INTRAVENOUS | Status: AC
  Administered 2018-10-27: 2 g via INTRAVENOUS
  Filled 2018-10-27: qty 100

## 2018-10-27 MED ORDER — METHOCARBAMOL 500 MG IVPB - SIMPLE MED
500.0000 mg | Freq: Four times a day (QID) | INTRAVENOUS | Status: DC | PRN
  Filled 2018-10-27: qty 50

## 2018-10-27 MED ORDER — LACTATED RINGERS IV SOLN
INTRAVENOUS | Status: DC
  Administered 2018-10-27 (×3): via INTRAVENOUS

## 2018-10-27 MED ORDER — BUPIVACAINE-EPINEPHRINE (PF) 0.25% -1:200000 IJ SOLN
INTRAMUSCULAR | Status: AC
  Filled 2018-10-27: qty 30

## 2018-10-27 MED ORDER — SENNA 8.6 MG PO TABS
1.0000 | ORAL_TABLET | Freq: Two times a day (BID) | ORAL | Status: DC
  Administered 2018-10-27 – 2018-10-28 (×2): 8.6 mg via ORAL
  Filled 2018-10-27 (×2): qty 1

## 2018-10-27 MED ORDER — ALUM & MAG HYDROXIDE-SIMETH 200-200-20 MG/5ML PO SUSP: 30.0000 mL | ORAL | Status: DC | PRN

## 2018-10-27 MED ORDER — HYDROCODONE-ACETAMINOPHEN 5-325 MG PO TABS
1.0000 | ORAL_TABLET | ORAL | Status: DC | PRN
  Administered 2018-10-27: 1 via ORAL

## 2018-10-27 MED ORDER — FENTANYL CITRATE (PF) 100 MCG/2ML IJ SOLN
INTRAMUSCULAR | Status: DC | PRN
  Administered 2018-10-27: 25 ug via INTRAVENOUS
  Administered 2018-10-27: 75 ug via INTRAVENOUS

## 2018-10-27 MED ORDER — SODIUM CHLORIDE 0.9 % IV SOLN: INTRAVENOUS | Status: DC

## 2018-10-27 MED ORDER — ONDANSETRON HCL 4 MG PO TABS: 4.0000 mg | ORAL_TABLET | Freq: Four times a day (QID) | ORAL | Status: DC | PRN

## 2018-10-27 MED ORDER — FENTANYL CITRATE (PF) 100 MCG/2ML IJ SOLN
INTRAMUSCULAR | Status: AC
  Filled 2018-10-27: qty 2

## 2018-10-27 MED ORDER — SODIUM CHLORIDE (PF) 0.9 % IJ SOLN
INTRAMUSCULAR | Status: DC | PRN
  Administered 2018-10-27: 30 mL via INTRAVENOUS

## 2018-10-27 MED ORDER — HYDROMORPHONE HCL 1 MG/ML IJ SOLN: 0.2500 mg | INTRAMUSCULAR | Status: DC | PRN

## 2018-10-27 MED ORDER — MIDAZOLAM HCL 5 MG/5ML IJ SOLN
INTRAMUSCULAR | Status: DC | PRN
  Administered 2018-10-27: 2 mg via INTRAVENOUS

## 2018-10-27 MED ORDER — DEXAMETHASONE SODIUM PHOSPHATE 10 MG/ML IJ SOLN
INTRAMUSCULAR | Status: DC | PRN
  Administered 2018-10-27: 10 mg via INTRAVENOUS

## 2018-10-27 MED ORDER — WATER FOR IRRIGATION, STERILE IR SOLN
Status: DC | PRN
  Administered 2018-10-27: 2000 mL

## 2018-10-27 MED ORDER — ALBUMIN HUMAN 5 % IV SOLN
INTRAVENOUS | Status: DC | PRN
  Administered 2018-10-27: 10:00:00 via INTRAVENOUS

## 2018-10-27 MED ORDER — SODIUM CHLORIDE (PF) 0.9 % IJ SOLN
INTRAMUSCULAR | Status: AC
  Filled 2018-10-27: qty 50

## 2018-10-27 MED ORDER — DIPHENHYDRAMINE HCL 12.5 MG/5ML PO ELIX: 12.5000 mg | ORAL_SOLUTION | ORAL | Status: DC | PRN

## 2018-10-27 MED ORDER — CEFAZOLIN SODIUM-DEXTROSE 2-4 GM/100ML-% IV SOLN
2.0000 g | Freq: Four times a day (QID) | INTRAVENOUS | Status: AC
  Administered 2018-10-27 (×2): 2 g via INTRAVENOUS
  Filled 2018-10-27 (×2): qty 100

## 2018-10-27 MED ORDER — METOCLOPRAMIDE HCL 5 MG PO TABS: 5.0000 mg | ORAL_TABLET | Freq: Three times a day (TID) | ORAL | Status: DC | PRN

## 2018-10-27 MED ORDER — ALBUMIN HUMAN 5 % IV SOLN
INTRAVENOUS | Status: AC
  Filled 2018-10-27: qty 500

## 2018-10-27 MED ORDER — ONDANSETRON HCL 4 MG/2ML IJ SOLN
INTRAMUSCULAR | Status: AC
  Filled 2018-10-27: qty 2

## 2018-10-27 MED ORDER — POLYETHYLENE GLYCOL 3350 17 G PO PACK: 17.0000 g | PACK | Freq: Every day | ORAL | Status: DC | PRN

## 2018-10-27 MED ORDER — MENTHOL 3 MG MT LOZG: 1.0000 | LOZENGE | OROMUCOSAL | Status: DC | PRN

## 2018-10-27 MED ORDER — ONDANSETRON HCL 4 MG/2ML IJ SOLN: 4.0000 mg | Freq: Four times a day (QID) | INTRAMUSCULAR | Status: DC | PRN

## 2018-10-27 MED ORDER — DEXAMETHASONE SODIUM PHOSPHATE 10 MG/ML IJ SOLN
10.0000 mg | Freq: Once | INTRAMUSCULAR | Status: AC
  Administered 2018-10-28: 10 mg via INTRAVENOUS
  Filled 2018-10-27: qty 1

## 2018-10-27 MED ORDER — METHOCARBAMOL 500 MG PO TABS
ORAL_TABLET | ORAL | Status: AC
  Filled 2018-10-27: qty 1

## 2018-10-27 MED ORDER — ACETAMINOPHEN 325 MG PO TABS: 325.0000 mg | ORAL_TABLET | Freq: Four times a day (QID) | ORAL | Status: DC | PRN

## 2018-10-27 MED ORDER — ISOPROPYL ALCOHOL 70 % SOLN
Status: AC
  Filled 2018-10-27: qty 480

## 2018-10-27 MED ORDER — SODIUM CHLORIDE 0.9 % IV SOLN
INTRAVENOUS | Status: DC | PRN
  Administered 2018-10-27: 20 ug/min via INTRAVENOUS

## 2018-10-27 MED ORDER — ONDANSETRON HCL 4 MG/2ML IJ SOLN
INTRAMUSCULAR | Status: DC | PRN
  Administered 2018-10-27: 4 mg via INTRAVENOUS

## 2018-10-27 MED ORDER — ASPIRIN 81 MG PO CHEW
81.0000 mg | CHEWABLE_TABLET | Freq: Two times a day (BID) | ORAL | Status: DC
  Administered 2018-10-27 – 2018-10-28 (×2): 81 mg via ORAL
  Filled 2018-10-27 (×2): qty 1

## 2018-10-27 MED ORDER — BUPIVACAINE IN DEXTROSE 0.75-8.25 % IT SOLN
INTRATHECAL | Status: DC | PRN
  Administered 2018-10-27: 2 mL via INTRATHECAL

## 2018-10-27 SURGICAL SUPPLY — 58 items
ADH SKN CLS APL DERMABOND .7 (GAUZE/BANDAGES/DRESSINGS) ×2
APL PRP STRL LF DISP 70% ISPRP (MISCELLANEOUS) ×1
BAG DECANTER FOR FLEXI CONT (MISCELLANEOUS) IMPLANT
BAG SPEC THK2 15X12 ZIP CLS (MISCELLANEOUS)
BAG ZIPLOCK 12X15 (MISCELLANEOUS) IMPLANT
BLADE SURG SZ10 CARB STEEL (BLADE) IMPLANT
CHLORAPREP W/TINT 26 (MISCELLANEOUS) ×3 IMPLANT
COVER PERINEAL POST (MISCELLANEOUS) ×3 IMPLANT
COVER SURGICAL LIGHT HANDLE (MISCELLANEOUS) ×3 IMPLANT
COVER WAND RF STERILE (DRAPES) IMPLANT
DECANTER SPIKE VIAL GLASS SM (MISCELLANEOUS) ×3 IMPLANT
DERMABOND ADVANCED (GAUZE/BANDAGES/DRESSINGS) ×4
DERMABOND ADVANCED .7 DNX12 (GAUZE/BANDAGES/DRESSINGS) ×2 IMPLANT
DRAPE IMP U-DRAPE 54X76 (DRAPES) ×3 IMPLANT
DRAPE SHEET LG 3/4 BI-LAMINATE (DRAPES) ×9 IMPLANT
DRAPE U-SHAPE 47X51 STRL (DRAPES) ×6 IMPLANT
DRSG AQUACEL AG ADV 3.5X10 (GAUZE/BANDAGES/DRESSINGS) ×3 IMPLANT
ELECT PENCIL ROCKER SW 15FT (MISCELLANEOUS) ×3 IMPLANT
ELECT REM PT RETURN 15FT ADLT (MISCELLANEOUS) ×3 IMPLANT
G7 VIT E NTRL LNR 36 SZG (Miscellaneous) ×2 IMPLANT
GAUZE SPONGE 4X4 12PLY STRL (GAUZE/BANDAGES/DRESSINGS) ×3 IMPLANT
GLOVE BIO SURGEON STRL SZ8.5 (GLOVE) ×6 IMPLANT
GLOVE BIOGEL PI IND STRL 8.5 (GLOVE) ×1 IMPLANT
GLOVE BIOGEL PI INDICATOR 8.5 (GLOVE) ×2
GOWN SPEC L3 XXLG W/TWL (GOWN DISPOSABLE) ×3 IMPLANT
HANDPIECE INTERPULSE COAX TIP (DISPOSABLE) ×3
HEAD CERAMIC BIOLOX DELTA 36MM (Head) ×2 IMPLANT
HOLDER FOLEY CATH W/STRAP (MISCELLANEOUS) ×3 IMPLANT
HOOD PEEL AWAY FLYTE STAYCOOL (MISCELLANEOUS) ×12 IMPLANT
JET LAVAGE IRRISEPT WOUND (IRRIGATION / IRRIGATOR) ×3
KIT TURNOVER KIT A (KITS) IMPLANT
LAVAGE JET IRRISEPT WOUND (IRRIGATION / IRRIGATOR) ×1 IMPLANT
LINER ACETABULAR G7 60MM SZG (Liner) ×2 IMPLANT
MANIFOLD NEPTUNE II (INSTRUMENTS) ×3 IMPLANT
MARKER SKIN DUAL TIP RULER LAB (MISCELLANEOUS) ×3 IMPLANT
NDL SAFETY ECLIPSE 18X1.5 (NEEDLE) ×1 IMPLANT
NDL SPNL 18GX3.5 QUINCKE PK (NEEDLE) ×1 IMPLANT
NEEDLE HYPO 18GX1.5 SHARP (NEEDLE) ×3
NEEDLE SPNL 18GX3.5 QUINCKE PK (NEEDLE) ×3 IMPLANT
PACK ANTERIOR HIP CUSTOM (KITS) ×3 IMPLANT
SAW OSC TIP CART 19.5X105X1.3 (SAW) ×3 IMPLANT
SEALER BIPOLAR AQUA 6.0 (INSTRUMENTS) ×3 IMPLANT
SET HNDPC FAN SPRY TIP SCT (DISPOSABLE) ×1 IMPLANT
STEM FEM CL 119X39.6 SZ17 133D (Stem) ×2 IMPLANT
SUT ETHIBOND NAB CT1 #1 30IN (SUTURE) ×6 IMPLANT
SUT MNCRL AB 3-0 PS2 18 (SUTURE) ×3 IMPLANT
SUT MNCRL AB 4-0 PS2 18 (SUTURE) ×3 IMPLANT
SUT MON AB 2-0 CT1 36 (SUTURE) ×6 IMPLANT
SUT STRATAFIX PDO 1 14 VIOLET (SUTURE) ×3
SUT STRATFX PDO 1 14 VIOLET (SUTURE) ×1
SUT VIC AB 2-0 CT1 27 (SUTURE) ×3
SUT VIC AB 2-0 CT1 TAPERPNT 27 (SUTURE) ×1 IMPLANT
SUTURE STRATFX PDO 1 14 VIOLET (SUTURE) ×1 IMPLANT
SYR 3ML LL SCALE MARK (SYRINGE) ×3 IMPLANT
TRAY FOLEY MTR SLVR 16FR STAT (SET/KITS/TRAYS/PACK) IMPLANT
WATER STERILE IRR 1000ML POUR (IV SOLUTION) ×3 IMPLANT
YANKAUER SUCT BULB TIP 10FT TU (MISCELLANEOUS) ×3 IMPLANT
YANKAUER SUCT BULB TIP NO VENT (SUCTIONS) ×4 IMPLANT

## 2018-10-27 NOTE — Anesthesia Preprocedure Evaluation (Signed)
Anesthesia Evaluation  Patient identified by MRN, date of birth, ID band Patient awake    Reviewed: Allergy & Precautions, NPO status , Patient's Chart, lab work & pertinent test results  Airway Mallampati: II  TM Distance: >3 FB Neck ROM: Full    Dental no notable dental hx.    Pulmonary sleep apnea and Continuous Positive Airway Pressure Ventilation , COPD, former smoker,    Pulmonary exam normal breath sounds clear to auscultation       Cardiovascular negative cardio ROS Normal cardiovascular exam Rhythm:Regular Rate:Normal     Neuro/Psych negative neurological ROS  negative psych ROS   GI/Hepatic negative GI ROS, Neg liver ROS,   Endo/Other  negative endocrine ROS  Renal/GU negative Renal ROS  negative genitourinary   Musculoskeletal  (+) Arthritis , Osteoarthritis,    Abdominal   Peds negative pediatric ROS (+)  Hematology negative hematology ROS (+)   Anesthesia Other Findings   Reproductive/Obstetrics negative OB ROS                             Anesthesia Physical Anesthesia Plan  ASA: III  Anesthesia Plan: Spinal   Post-op Pain Management:    Induction: Intravenous  PONV Risk Score and Plan:   Airway Management Planned: Simple Face Mask  Additional Equipment:   Intra-op Plan:   Post-operative Plan:   Informed Consent: I have reviewed the patients History and Physical, chart, labs and discussed the procedure including the risks, benefits and alternatives for the proposed anesthesia with the patient or authorized representative who has indicated his/her understanding and acceptance.     Dental advisory given  Plan Discussed with: CRNA and Surgeon  Anesthesia Plan Comments:         Anesthesia Quick Evaluation

## 2018-10-27 NOTE — Progress Notes (Signed)
Physical Therapy Treatment Patient Details Name: Brandon Kaufman MRN: [id number] DOB: 25-Nov-1955 Today's Date: 10/27/2018    History of Present Illness Pt s/p L THR    PT Comments    Pt s/p L THR and presents with decreased L LE strength/ROM and post op pain limiting functional mobility.  Pt should progress to dc home with family assist.   Follow Up Recommendations  Follow surgeon's recommendation for DC plan and follow-up therapies     Equipment Recommendations  3in1 (PT)    Recommendations for Other Services       Precautions / Restrictions Precautions Precautions: Fall Restrictions Weight Bearing Restrictions: No Other Position/Activity Restrictions: WBAT    Mobility  Bed Mobility Overal bed mobility: Needs Assistance Bed Mobility: Supine to Sit     Supine to sit: Min assist     General bed mobility comments: cues for sequence and use of L LE to self assist  Transfers Overall transfer level: Needs assistance Equipment used: Rolling walker (2 wheeled) Transfers: Sit to/from Stand Sit to Stand: Min assist;+2 physical assistance;+2 safety/equipment         General transfer comment: cues for LE managment and use of UEs to self assist  Ambulation/Gait Ambulation/Gait assistance: Min assist;+2 safety/equipment Gait Distance (Feet): 35 Feet Assistive device: Rolling walker (2 wheeled) Gait Pattern/deviations: Step-to pattern;Decreased step length - left;Decreased step length - right;Shuffle;Trunk flexed Gait velocity: decr   General Gait Details: cues for sequence, posture and position from [employer]W   Stairs             Wheelchair Mobility    Modified Rankin (Stroke Patients Only)       Balance Overall balance assessment: Mild deficits observed, not formally tested                                          Cognition Arousal/Alertness: Awake/alert Behavior During Therapy: WFL for tasks assessed/performed Overall Cognitive Status:  Within Functional Limits for tasks assessed                                        Exercises Total Joint Exercises Ankle Circles/Pumps: AROM;Both;15 reps;Supine    General Comments        Pertinent Vitals/Pain Pain Assessment: 0-10 Pain Score: 3  Pain Location: R hip Pain Descriptors / Indicators: Aching;Sore Pain Intervention(s): Limited activity within patient's tolerance;Monitored during session;Premedicated before session;Ice applied    Home Living Family/patient expects to be discharged to:: Private residence Living Arrangements: Spouse/significant other Available Help at Discharge: Family Type of Home: Apartment Home Access: Stairs to enter     Home Equipment: Environmental consultantWalker - 2 wheels      Prior Function Level of Independence: Independent          PT Goals (current goals can now be found in the care plan section) Acute Rehab PT Goals Patient Stated Goal: Regain IND PT Goal Formulation: With patient Time For Goal Achievement: 11/03/18 Potential to Achieve Goals: Good    Frequency    7X/week      PT Plan      Co-evaluation              Brandon Kaufman PT "6 Clicks" Mobility   Outcome Measure  Help needed turning from your back to your side while in a flat bed  without using bedrails?: A Little Help needed moving from lying on your back to sitting on the side of a flat bed without using bedrails?: A Little Help needed moving to and from a bed to a chair (including a wheelchair)?: A Little Help needed standing up from a chair using your arms (e.g., wheelchair or bedside chair)?: A Little Help needed to walk in hospital room?: A Little Help needed climbing 3-5 steps with a railing? : A Lot 6 Click Score: 17    End of Session Equipment Utilized During Treatment: Gait belt Activity Tolerance: Patient tolerated treatment well Patient left: in chair;with call bell/phone within reach;with chair alarm set Nurse Communication: Mobility status PT  Visit Diagnosis: Difficulty in walking, not elsewhere classified (R26.2)     Time: 8250-0370 PT Time Calculation (min) (ACUTE ONLY): 25 min  Charges:  $Gait Training: 8-22 mins                     [place] Pager [phone] Office [phone]    Lema Heinkel 10/27/2018, 4:35 PM

## 2018-10-27 NOTE — Interval H&P Note (Signed)
History and Physical Interval Note:  10/27/2018 6:59 PM  Brandon Kaufman  has presented today for surgery, with the diagnosis of Degnerative joint disease right hip.  The various methods of treatment have been discussed with the patient and family. After consideration of risks, benefits and other options for treatment, the patient has consented to  Procedure(s): TOTAL HIP ARTHROPLASTY ANTERIOR APPROACH (Right) as a surgical intervention.  The patient's history has been reviewed, patient examined, no change in status, stable for surgery.  I have reviewed the patient's chart and labs.  Questions were answered to the patient's satisfaction.     [place] Renia Mikelson

## 2018-10-27 NOTE — Transfer of Care (Signed)
Immediate Anesthesia Transfer of Care Note  Patient: Brandon Kaufman  Procedure(s) Performed: TOTAL HIP ARTHROPLASTY ANTERIOR APPROACH (Right Hip)  Patient Location: PACU  Anesthesia Type:MAC and Spinal  Level of Consciousness: awake, alert , oriented and patient cooperative  Airway & Oxygen Therapy: Patient Spontanous Breathing and Patient connected to face mask oxygen  Post-op Assessment: Report given to RN and Post -op Vital signs reviewed and stable  Post vital signs: Reviewed and stable  Last Vitals:  Vitals Value Taken Time  BP 86/47 10/27/18 1100  Temp    Pulse 86 10/27/18 1102  Resp 12 10/27/18 1102  SpO2 99 % 10/27/18 1102  Vitals shown include unvalidated device data.  Last Pain:  Vitals:   10/27/18 0601  TempSrc:   PainSc: 0-No pain         Complications: No apparent anesthesia complications

## 2018-10-27 NOTE — Anesthesia Procedure Notes (Signed)
Spinal  Patient location during procedure: OR Start time: 10/27/2018 7:44 AM End time: 10/27/2018 7:49 AM Staffing Anesthesiologist: Myrtie Soman, MD Performed: anesthesiologist  Preanesthetic Checklist Completed: patient identified, site marked, surgical consent, pre-op evaluation, timeout performed, IV checked, risks and benefits discussed and monitors and equipment checked Spinal Block Patient position: sitting Prep: ChloraPrep Patient monitoring: heart rate, continuous pulse ox and blood pressure Approach: midline Location: L3-4 Injection technique: single-shot Needle Needle type: Sprotte  Needle gauge: 24 G Needle length: 9 cm Additional Notes Expiration date of kit checked and confirmed. Patient tolerated procedure well, without complications.

## 2018-10-27 NOTE — Discharge Instructions (Signed)
°Dr. Akeiba Axelson °Joint Replacement Specialist °[facility] °[address] °[place], [place] [address] °[phone] ° ° °TOTAL HIP REPLACEMENT POSTOPERATIVE DIRECTIONS ° ° ° °Hip Rehabilitation, Guidelines Following Surgery  ° °WEIGHT BEARING °Weight bearing as tolerated with assist device (walker, cane, etc) as directed, use it as long as suggested by your surgeon or therapist, typically at least 4-6 weeks. ° °The results of a hip operation are greatly improved after range of motion and muscle strengthening exercises. Follow all safety measures which are given to protect your hip. If any of these exercises cause increased pain or swelling in your joint, decrease the amount until you are comfortable again. Then slowly increase the exercises. Call your caregiver if you have problems or questions.  ° °HOME CARE INSTRUCTIONS  °Most of the following instructions are designed to prevent the dislocation of your new hip.  °Remove items at home which could result in a fall. This includes throw rugs or furniture in walking pathways.  °Continue medications as instructed at time of discharge. °· You may have some home medications which will be placed on hold until you complete the course of blood thinner medication. °· You may start showering once you are discharged home. Do not remove your dressing. °Do not put on socks or shoes without following the instructions of your caregivers.   °Sit on chairs with arms. Use the chair arms to help push yourself up when arising.  °Arrange for the use of a toilet seat elevator so you are not sitting low.  °· Walk with walker as instructed.  °You may resume a sexual relationship in one month or when given the OK by your caregiver.  °Use walker as long as suggested by your caregivers.  °You may put full weight on your legs and walk as much as is comfortable. °Avoid periods of inactivity such as sitting longer than an hour when not asleep. This helps prevent  blood clots.  °You may return to work once you are cleared by your surgeon.  °Do not drive a car for 6 weeks or until released by your surgeon.  °Do not drive while taking narcotics.  °Wear elastic stockings for two weeks following surgery during the day but you may remove then at night.  °Make sure you keep all of your appointments after your operation with all of your doctors and caregivers. You should call the office at the above phone number and make an appointment for approximately two weeks after the date of your surgery. °Please pick up a stool softener and laxative for home use as long as you are requiring pain medications. °· ICE to the affected hip every three hours for 30 minutes at a time and then as needed for pain and swelling. Continue to use ice on the hip for pain and swelling from surgery. You may notice swelling that will progress down to the foot and ankle.  This is normal after surgery.  Elevate the leg when you are not up walking on it.   °It is important for you to complete the blood thinner medication as prescribed by your doctor. °· Continue to use the breathing machine which will help keep your temperature down.  It is common for your temperature to cycle up and down following surgery, especially at night when you are not up moving around and exerting yourself.  The breathing machine keeps your lungs expanded and your temperature down. ° °RANGE OF MOTION AND STRENGTHENING EXERCISES  °These exercises are   designed to help you keep full movement of your hip joint. Follow your caregiver's or physical therapist's instructions. Perform all exercises about fifteen times, three times per day or as directed. Exercise both hips, even if you have had only one joint replacement. These exercises can be done on a training (exercise) mat, on the floor, on a table or on a bed. Use whatever works the best and is most comfortable for you. Use music or television while you are exercising so that the exercises  are a pleasant break in your day. This will make your life better with the exercises acting as a break in routine you can look forward to.  °Lying on your back, slowly slide your foot toward your buttocks, raising your knee up off the floor. Then slowly slide your foot back down until your leg is straight again.  °Lying on your back spread your legs as far apart as you can without causing discomfort.  °Lying on your side, raise your upper leg and foot straight up from the floor as far as is comfortable. Slowly lower the leg and repeat.  °Lying on your back, tighten up the muscle in the front of your thigh (quadriceps muscles). You can do this by keeping your leg straight and trying to raise your heel off the floor. This helps strengthen the largest muscle supporting your knee.  °Lying on your back, tighten up the muscles of your buttocks both with the legs straight and with the knee bent at a comfortable angle while keeping your heel on the floor.  ° °SKILLED REHAB INSTRUCTIONS: °If the patient is transferred to a skilled rehab facility following release from the hospital, a list of the current medications will be sent to the facility for the patient to continue.  When discharged from the skilled rehab facility, please have the facility set up the patient's Home Health Physical Therapy prior to being released. Also, the skilled facility will be responsible for providing the patient with their medications at time of release from the facility to include their pain medication and their blood thinner medication. If the patient is still at the rehab facility at time of the two week follow up appointment, the skilled rehab facility will also need to assist the patient in arranging follow up appointment in our office and any transportation needs. ° °MAKE SURE YOU:  °Understand these instructions.  °Will watch your condition.  °Will get help right away if you are not doing well or get worse. ° °Pick up stool softner and  laxative for home use following surgery while on pain medications. °Do not remove your dressing. °The dressing is waterproof--it is OK to take showers. °Continue to use ice for pain and swelling after surgery. °Do not use any lotions or creams on the incision until instructed by your surgeon. °Total Hip Protocol. ° ° °

## 2018-10-27 NOTE — Op Note (Signed)
OPERATIVE REPORT  SURGEON: Rod Can, MD   ASSISTANT: Georgann Housekeeper, RNFA.  PREOPERATIVE DIAGNOSIS: Right hip arthritis.   POSTOPERATIVE DIAGNOSIS: Right hip arthritis.   PROCEDURE: Right total hip arthroplasty, anterior approach.   IMPLANTS: Biomet Taperloc Complete Microplasty stem, size 17 x 119, hi offset. Biomet G7 Cup, size 60 mm. Biomet E1 liner, size 36 mm, G, neutral. Biomet Biolox ceramic head ball, size 36 + 0 mm.  ANESTHESIA:  Spinal  ESTIMATED BLOOD LOSS:-450 mL    ANTIBIOTICS: 2 g Ancef.  DRAINS: None.  COMPLICATIONS: None.   CONDITION: PACU - hemodynamically stable.   BRIEF CLINICAL NOTE: Brandon Kaufman is a 63 y.o. male with a long-standing history of Right hip arthritis.  He has an ipsilateral subtrochanteric malunion.  After failing conservative management, the patient was indicated for total hip arthroplasty. The risks, benefits, and alternatives to the procedure were explained, and the patient elected to proceed.  PROCEDURE IN DETAIL: Surgical site was marked by myself in the pre-op holding area. Once inside the operating room, spinal anesthesia was obtained, and a foley catheter was inserted. The patient was then positioned on the Hana table. All bony prominences were well padded. The hip was prepped and draped in the normal sterile surgical fashion. A time-out was called verifying side and site of surgery. The patient received IV antibiotics within 60 minutes of beginning the procedure.  The direct anterior approach to the hip was performed through the Hueter interval. Lateral femoral circumflex vessels were treated with the Auqumantys. The anterior capsule was exposed and an inverted T capsulotomy was made.The femoral neck cut was made to the level of the templated cut. A corkscrew was placed into the head and the head was removed. The femoral head was found to have eburnated bone. The head was passed to the back table and was  measured.  Acetabular exposure was achieved, and the pulvinar and labrum were excised. Sequential reaming of the acetabulum was then performed up to a size 59 mm reamer. A 60 mm cup was then opened and impacted into place at approximately 40 degrees of abduction and 20 degrees of anteversion. The final polyethylene liner was impacted into place and acetabular osteophytes were removed.   I then gained femoral exposure taking care to protect the abductors and greater trochanter. This was performed using standard external rotation, extension, and adduction. The capsule was peeled off the inner aspect of the greater trochanter, taking care to preserve the short external rotators. A cookie cutter was used to enter the femoral canal, and then the femoral canal finder was placed. Sequential broaching was performed up to a size 17. Calcar planer was used on the femoral neck remnant. I placed a hi offset neck and a trial head ball. The hip was reduced. Leg lengths and offset were checked fluoroscopically.I confirmed broach placement with a frog lateral fluoroscopy as well as a portable AP radiograph of the proximal femur. The hip was dislocated and trial components were removed. The final implants were placed, and the hip was reduced.  Fluoroscopy was used to confirm component position and leg lengths. At 90 degrees of external rotation and full extension, the hip was stable to an anterior directed force.  The wound was copiously irrigated with Irrisept solution and normal saline using pulse lavage. Marcaine solution was injected into the periarticular soft tissue. The wound was closed in layers using #1 Vicryl and V-Loc for the fascia, 2-0 Vicryl for the subcutaneous fat, 2-0 Monocryl for the deep dermal  layer, 3-0 running Monocryl subcuticular stitch, and Dermabond for the skin. Once the glue was fully dried, an Aquacell Ag dressing was applied. The patient was transported to the recovery room in  stable condition. Sponge, needle, and instrument counts were correct at the end of the case x2. The patient tolerated the procedure well and there were no known complications.  Please note that a surgical assistant was a medical necessity for this procedure to perform it in a safe and expeditious manner. Assistant was necessary to provide appropriate retraction of vital neurovascular structures, to prevent femoral fracture, and to allow for anatomic placement of the prosthesis.

## 2018-10-28 ENCOUNTER — Other Ambulatory Visit: Payer: Self-pay

## 2018-10-28 DIAGNOSIS — M12551 Traumatic arthropathy, right hip: Secondary | ICD-10-CM | POA: Diagnosis not present

## 2018-10-28 LAB — BASIC METABOLIC PANEL
Anion gap: 7 (ref 5–15)
BUN: 13 mg/dL (ref 8–23)
CO2: 26 mmol/L (ref 22–32)
Calcium: 8.5 mg/dL — ABNORMAL LOW (ref 8.9–10.3)
Chloride: 105 mmol/L (ref 98–111)
Creatinine, Ser: 0.84 mg/dL (ref 0.61–1.24)
GFR calc Af Amer: >60 mL/min (ref >60)
GFR calc non Af Amer: >60 mL/min (ref >60)
Glucose, Bld: 134 mg/dL — ABNORMAL HIGH (ref 70–99)
Potassium: 4.2 mmol/L (ref 3.5–5.1)
Sodium: 138 mmol/L (ref 135–145)

## 2018-10-28 LAB — CBC
HCT: 36.1 % — ABNORMAL LOW (ref 39.0–52.0)
Hemoglobin: 11.6 g/dL — ABNORMAL LOW (ref 13.0–17.0)
MCH: 31.4 pg (ref 26.0–34.0)
MCHC: 32.1 g/dL (ref 30.0–36.0)
MCV: 97.6 fL (ref 80.0–100.0)
Platelets: 123 10*3/uL — ABNORMAL LOW (ref 150–400)
RBC: 3.7 MIL/uL — ABNORMAL LOW (ref 4.22–5.81)
RDW: 12.9 % (ref 11.5–15.5)
WBC: 11.5 10*3/uL — ABNORMAL HIGH (ref 4.0–10.5)
nRBC: 0 % (ref 0.0–0.2)

## 2018-10-28 MED ORDER — HYDROCODONE-ACETAMINOPHEN 5-325 MG PO TABS: 1.0000 | ORAL_TABLET | ORAL | 0 refills | Status: AC | PRN

## 2018-10-28 MED ORDER — ASPIRIN 81 MG PO CHEW: 81.0000 mg | CHEWABLE_TABLET | Freq: Two times a day (BID) | ORAL | 1 refills | Status: AC

## 2018-10-28 MED ORDER — DOCUSATE SODIUM 100 MG PO CAPS: 100.0000 mg | ORAL_CAPSULE | Freq: Two times a day (BID) | ORAL | 1 refills | Status: AC

## 2018-10-28 MED ORDER — ONDANSETRON HCL 4 MG PO TABS: 4.0000 mg | ORAL_TABLET | Freq: Four times a day (QID) | ORAL | 0 refills | Status: AC | PRN

## 2018-10-28 MED ORDER — SENNA 8.6 MG PO TABS: 1.0000 | ORAL_TABLET | Freq: Two times a day (BID) | ORAL | 0 refills | Status: AC

## 2018-10-28 NOTE — Progress Notes (Signed)
Physical Therapy Treatment Patient Details Name: Brandon Kaufman MRN: [id number] DOB: 16-Apr-1956 Today's Date: 10/28/2018    History of Present Illness Pt s/p L THR    PT Comments    Pt progressing well with mobility and eager for return home.  Pt reviewed car transfers, stairs and home therex with progression and written instruction provided.   Follow Up Recommendations  Follow surgeon's recommendation for DC plan and follow-up therapies     Equipment Recommendations  3in1 (PT)    Recommendations for Other Services       Precautions / Restrictions Precautions Precautions: Fall Restrictions Weight Bearing Restrictions: No Other Position/Activity Restrictions: WBAT    Mobility  Bed Mobility Overal bed mobility: Needs Assistance Bed Mobility: Supine to Sit     Supine to sit: Supervision        Transfers Overall transfer level: Needs assistance Equipment used: Rolling walker (2 wheeled) Transfers: Sit to/from Stand Sit to Stand: Supervision         General transfer comment: cues for LE managment and use of UEs to self assist  Ambulation/Gait Ambulation/Gait assistance: Min guard;Supervision Gait Distance (Feet): 222 Feet Assistive device: Rolling walker (2 wheeled) Gait Pattern/deviations: Step-to pattern;Decreased step length - left;Decreased step length - right;Shuffle;Trunk flexed Gait velocity: decr   General Gait Details: cues for sequence, posture and position from RW   Stairs Stairs: Yes Stairs assistance: Min guard Stair Management: No rails;Forwards;Step to pattern;With walker Number of Stairs: 2 General stair comments: single step twice   Wheelchair Mobility    Modified Rankin (Stroke Patients Only)       Balance Overall balance assessment: Mild deficits observed, not formally tested                                          Cognition Arousal/Alertness: Awake/alert Behavior During Therapy: WFL for tasks  assessed/performed Overall Cognitive Status: Within Functional Limits for tasks assessed                                        Exercises Total Joint Exercises Ankle Circles/Pumps: AROM;Both;15 reps;Supine Quad Sets: AROM;Both;10 reps;Supine Heel Slides: AAROM;Right;20 reps;Supine Hip ABduction/ADduction: AAROM;Right;15 reps;Supine Long Arc Quad: AROM;Right;10 reps;Seated    General Comments        Pertinent Vitals/Pain Pain Assessment: 0-10 Pain Score: 3  Pain Location: R hip Pain Descriptors / Indicators: Aching;Sore Pain Intervention(s): Limited activity within patient's tolerance;Monitored during session;Ice applied    Home Living                      Prior Function            PT Goals (current goals can now be found in the care plan section) Acute Rehab PT Goals Patient Stated Goal: Regain IND PT Goal Formulation: With patient Time For Goal Achievement: 11/03/18 Potential to Achieve Goals: Good Progress towards PT goals: Progressing toward goals    Frequency    7X/week      PT Plan Current plan remains appropriate    Co-evaluation              AM-PAC PT "6 Clicks" Mobility   Outcome Measure  Help needed turning from your back to your side while in a flat bed without using bedrails?: A Little Help needed  moving from lying on your back to sitting on the side of a flat bed without using bedrails?: A Little Help needed moving to and from a bed to a chair (including a wheelchair)?: A Little Help needed standing up from a chair using your arms (e.g., wheelchair or bedside chair)?: A Little Help needed to walk in hospital room?: A Little Help needed climbing 3-5 steps with a railing? : A Little 6 Click Score: 18    End of Session Equipment Utilized During Treatment: Gait belt Activity Tolerance: Patient tolerated treatment well Patient left: in chair;with call bell/phone within reach;with chair alarm set Nurse  Communication: Mobility status PT Visit Diagnosis: Difficulty in walking, not elsewhere classified (R26.2)     Time: 8828-0034 PT Time Calculation (min) (ACUTE ONLY): 40 min  Charges:  $Gait Training: 8-22 mins $Therapeutic Exercise: 8-22 mins $Therapeutic Activity: 8-22 mins                     [place] Pager [phone] Office [phone]    Brandon Kaufman 10/28/2018, 11:48 AM

## 2018-10-28 NOTE — Plan of Care (Signed)
Pt ready for discharge home

## 2018-10-28 NOTE — Plan of Care (Signed)
Plan of care reviewed and discussed with the patient. 

## 2018-10-28 NOTE — Anesthesia Postprocedure Evaluation (Signed)
Anesthesia Post Note  Patient: Hjalmar Ballengee  Procedure(s) Performed: TOTAL HIP ARTHROPLASTY ANTERIOR APPROACH (Right Hip)     Patient location during evaluation: PACU Anesthesia Type: Spinal Level of consciousness: oriented and awake and alert Pain management: pain level controlled Vital Signs Assessment: post-procedure vital signs reviewed and stable Respiratory status: spontaneous breathing, respiratory function stable and patient connected to nasal cannula oxygen Cardiovascular status: blood pressure returned to baseline and stable Postop Assessment: no headache, no backache and no apparent nausea or vomiting Anesthetic complications: no    Last Vitals:  Vitals:   10/28/18 0109 10/28/18 0546  BP: 112/63 112/64  Pulse: 72 66  Resp: 16 16  Temp: 36.8 C 36.6 C  SpO2: 99% 100%    Last Pain:  Vitals:   10/28/18 0546  TempSrc: Oral  PainSc:                  Janisha Bueso S

## 2018-10-28 NOTE — Progress Notes (Signed)
    Subjective:  Patient reports pain as mild to moderate.  Denies N/V/CP/SOB. No c/o.  Objective:   VITALS:   Vitals:   10/27/18 2207 10/28/18 0109 10/28/18 0546 10/28/18 1037  BP: 114/64 112/63 112/64 123/69  Pulse: 73 72 66 77  Resp: 18 16 16 16   Temp: 98 F (36.7 C) 98.3 F (36.8 C) 97.9 F (36.6 C) 97.9 F (36.6 C)  TempSrc: Oral Oral Oral Oral  SpO2: 97% 99% 100% 100%  Weight:      Height:        NAD ABD soft Sensation intact distally Intact pulses distally Dorsiflexion/Plantar flexion intact Incision: dressing C/D/I Compartment soft   Lab Results  Component Value Date   WBC 11.5 (H) 10/28/2018   HGB 11.6 (L) 10/28/2018   HCT 36.1 (L) 10/28/2018   MCV 97.6 10/28/2018   PLT 123 (L) 10/28/2018   BMET    Component Value Date/Time   NA 138 10/28/2018 0219   K 4.2 10/28/2018 0219   CL 105 10/28/2018 0219   CO2 26 10/28/2018 0219   GLUCOSE 134 (H) 10/28/2018 0219   BUN 13 10/28/2018 0219   CREATININE 0.84 10/28/2018 0219   CALCIUM 8.5 (L) 10/28/2018 0219   GFRNONAA >60 10/28/2018 0219   GFRAA >60 10/28/2018 0219     Assessment/Plan: 1 Day Post-Op   Principal Problem:   Traumatic arthritis of right hip   WBAT with walker DVT ppx: Aspirin, SCDs, TEDS PO pain control PT/OT Dispo: D/C home with HEP   Brandon Kaufman 10/28/2018, 12:07 PM   Rod Can, MD Cell: [phone] [place] [facility] now [facility]  [place] [address], [address], [place], [place] [address] Phone: [phone] [email or web address]  [employer]

## 2018-10-28 NOTE — Discharge Summary (Signed)
Physician Discharge Summary  Patient ID: Brandon Kaufman MRN: [id number] DOB/AGE: 1955/05/01 63 y.o.  Admit date: 10/27/2018 Discharge date: 10/28/2018  Admission Diagnoses:  Traumatic arthritis of right hip  Discharge Diagnoses:  Principal Problem:   Traumatic arthritis of right hip   Past Medical History:  Diagnosis Date  . Arthritis   . COPD (chronic obstructive pulmonary disease) ([place])   . Pneumonia   . Sleep apnea    cpap    Surgeries: Procedure(s): TOTAL HIP ARTHROPLASTY ANTERIOR APPROACH on 10/27/2018   Consultants (if any):   Discharged Condition: Improved  Hospital Course: Anhad Sheeley is an 63 y.o. male who was admitted 10/27/2018 with a diagnosis of Traumatic arthritis of right hip and went to the operating room on 10/27/2018 and underwent the above named procedures.    He was given perioperative antibiotics:  Anti-infectives (From admission, onward)   Start     Dose/Rate Route Frequency Ordered Stop   10/27/18 1400  ceFAZolin (ANCEF) IVPB 2g/100 mL premix     2 g 200 mL/hr over 30 Minutes Intravenous Every 6 hours 10/27/18 1310 10/27/18 2045   10/27/18 0600  ceFAZolin (ANCEF) IVPB 2g/100 mL premix     2 g 200 mL/hr over 30 Minutes Intravenous On call to O.R. 10/27/18 0532 10/27/18 0745    .  He was given sequential compression devices, early ambulation, and ASA for DVT prophylaxis.  He benefited maximally from the hospital stay and there were no complications.    Recent vital signs:  Vitals:   10/28/18 0546 10/28/18 1037  BP: 112/64 123/69  Pulse: 66 77  Resp: 16 16  Temp: 97.9 F (36.6 C) 97.9 F (36.6 C)  SpO2: 100% 100%    Recent laboratory studies:  Lab Results  Component Value Date   HGB 11.6 (L) 10/28/2018   HGB 15.5 10/24/2018   Lab Results  Component Value Date   WBC 11.5 (H) 10/28/2018   PLT 123 (L) 10/28/2018   Lab Results  Component Value Date   INR 1.0 10/24/2018   Lab Results  Component Value Date   NA 138 10/28/2018   K  4.2 10/28/2018   CL 105 10/28/2018   CO2 26 10/28/2018   BUN 13 10/28/2018   CREATININE 0.84 10/28/2018   GLUCOSE 134 (H) 10/28/2018    Discharge Medications:   Allergies as of 10/28/2018   No Known Allergies     Medication List    TAKE these medications   albuterol 108 (90 Base) MCG/ACT inhaler Commonly known as: VENTOLIN HFA Inhale 2 puffs into the lungs every 6 (six) hours as needed for wheezing or shortness of breath.   aspirin 81 MG chewable tablet Chew 1 tablet (81 mg total) by mouth 2 (two) times daily.   atorvastatin 40 MG tablet Commonly known as: LIPITOR Take 40 mg by mouth daily.   bimatoprost 0.01 % Soln Commonly known as: LUMIGAN Place 1 drop into both eyes at bedtime.   docusate sodium 100 MG capsule Commonly known as: COLACE Take 1 capsule (100 mg total) by mouth 2 (two) times daily.   HYDROcodone-acetaminophen 5-325 MG tablet Commonly known as: NORCO/VICODIN Take 1 tablet by mouth every 4 (four) hours as needed for moderate pain (pain score 4-6).   loratadine 10 MG tablet Commonly known as: CLARITIN Take 10 mg by mouth daily.   ondansetron 4 MG tablet Commonly known as: ZOFRAN Take 1 tablet (4 mg total) by mouth every 6 (six) hours as needed for nausea.  senna 8.6 MG Tabs tablet Commonly known as: SENOKOT Take 1 tablet (8.6 mg total) by mouth 2 (two) times daily.       Diagnostic Studies: Dg C-arm 1-60 Min-no Report  Result Date: 10/27/2018 CLINICAL DATA:  Intraoperative imaging for right hip replacement. EXAM: OPERATIVE RIGHT HIP (WITH PELVIS IF PERFORMED) 3 VIEWS TECHNIQUE: Fluoroscopic spot image(s) were submitted for interpretation post-operatively. COMPARISON:  Plain films right hip 07/05/2018. FINDINGS: Three fluoroscopic spot views of the low pelvis and right hip are provided. Total right hip replacement is in place. No acute abnormality. IMPRESSION: Intraoperative imaging for right hip replacement. Electronically Signed   By: Drusilla Kannerhomas   Dalessio M.D.   On: 10/27/2018 10:44   Dg Hip Port Unilat With Pelvis 1v Right  Result Date: 10/27/2018 CLINICAL DATA:  Arthritis of the right hip. Status post total hip prosthesis insertion. EXAM: DG HIP (WITH OR WITHOUT PELVIS) 1V PORT RIGHT COMPARISON:  Radiographs dated 07/05/2018 FINDINGS: The acetabular and femoral components of the right total hip prosthesis appear in excellent position in the AP projection. Slight arthritic changes of the left hip. Slight chronic deformity of the proximal right femoral shaft, possibly related to remote trauma. IMPRESSION: Satisfactory appearance of the right hip in the AP projection after total hip arthroplasty. Electronically Signed   By: Francene BoyersJames  Maxwell M.D.   On: 10/27/2018 11:33   Dg Hip Port Unilat With Pelvis 1v Right  Result Date: 10/27/2018 CLINICAL DATA:  Status post right hip replacement. EXAM: DG HIP (WITH OR WITHOUT PELVIS) 1V PORT RIGHT COMPARISON:  Radiographs of July 05, 2018. FINDINGS: The right femoral and acetabular components appear to be well situated. IMPRESSION: Right femoral and acetabular components appear to be well situated. Electronically Signed   By: Lupita RaiderJames  Green Jr M.D.   On: 10/27/2018 10:06   Dg Hip Operative Unilat W Or W/o Pelvis Right  Result Date: 10/27/2018 CLINICAL DATA:  Intraoperative imaging for right hip replacement. EXAM: OPERATIVE RIGHT HIP (WITH PELVIS IF PERFORMED) 3 VIEWS TECHNIQUE: Fluoroscopic spot image(s) were submitted for interpretation post-operatively. COMPARISON:  Plain films right hip 07/05/2018. FINDINGS: Three fluoroscopic spot views of the low pelvis and right hip are provided. Total right hip replacement is in place. No acute abnormality. IMPRESSION: Intraoperative imaging for right hip replacement. Electronically Signed   By: Drusilla Kannerhomas  Dalessio M.D.   On: 10/27/2018 10:44    Disposition:    Discharge Instructions    Call MD / Call 911   Complete by: As directed    If you experience chest pain or  shortness of breath, CALL 911 and be transported to the hospital emergency room.  If you develope a fever above 101 F, pus (white drainage) or increased drainage or redness at the wound, or calf pain, call your surgeon's office.   Constipation Prevention   Complete by: As directed    Drink plenty of fluids.  Prune juice may be helpful.  You may use a stool softener, such as Colace (over the counter) 100 mg twice a day.  Use MiraLax (over the counter) for constipation as needed.   Diet - low sodium heart healthy   Complete by: As directed    Increase activity slowly as tolerated   Complete by: As directed       Follow-up Information    Loann Chahal, Arlys JohnBrian, MD. Schedule an appointment as soon as possible for a visit in 2 weeks.   Specialty: Orthopedic Surgery Why: For wound re-check Contact information: [address]  [place] [id number] [phone]            Signed: Iline OvenBrian J [facility] 10/28/2018, 4:35 PM

## 2018-11-01 ENCOUNTER — Encounter (HOSPITAL_COMMUNITY): Payer: Self-pay | Admitting: Orthopedic Surgery

## 2019-08-05 ENCOUNTER — Ambulatory Visit: Payer: BC Managed Care – PPO | Attending: Internal Medicine

## 2019-08-05 DIAGNOSIS — Z23 Encounter for immunization: Secondary | ICD-10-CM

## 2019-08-05 NOTE — Progress Notes (Signed)
   Covid-19 Vaccination Clinic  Name:  Srihari Shellhammer    MRN: [id number] DOB: 01/02/1956  08/05/2019  Mr. Riva was observed post Covid-19 immunization for 15 minutes without incident. He was provided with Vaccine Information Sheet and instruction to access the V-Safe system.   Mr. Bernhart was instructed to call 911 with any severe reactions post vaccine: Marland Kitchen Difficulty breathing  . Swelling of face and throat  . A fast heartbeat  . A bad rash all over body  . Dizziness and weakness   Immunizations Administered    Name Date Dose VIS Date Route   Pfizer COVID-19 Vaccine 08/05/2019  8:12 AM 0.3 mL 03/31/2019 Intramuscular   Manufacturer: [employer], [employer]   Lot: [id number]   NDC: [address]-9

## 2019-08-28 ENCOUNTER — Ambulatory Visit: Payer: BC Managed Care – PPO | Attending: Internal Medicine

## 2019-08-28 DIAGNOSIS — Z23 Encounter for immunization: Secondary | ICD-10-CM

## 2019-08-28 NOTE — Progress Notes (Signed)
   Covid-19 Vaccination Clinic  Name:  Shaquelle Hernon    MRN: [id number] DOB: 23-Nov-1955  08/28/2019  Mr. Darling was observed post Covid-19 immunization for 15 minutes without incident. He was provided with Vaccine Information Sheet and instruction to access the V-Safe system.   Mr. Sanfilippo was instructed to call 911 with any severe reactions post vaccine: Marland Kitchen Difficulty breathing  . Swelling of face and throat  . A fast heartbeat  . A bad rash all over body  . Dizziness and weakness   Immunizations Administered    Name Date Dose VIS Date Route   Pfizer COVID-19 Vaccine 08/28/2019  9:12 AM 0.3 mL 06/14/2018 Intramuscular   Manufacturer: [employer], [employer]   Lot: [id number]   NDC: [address]-4

## 2020-01-16 IMAGING — RF OPERATIVE RIGHT HIP WITH PELVIS
1 series · 3 of 3 positions shown · non-contrast
Comparison: Plain films right hip 07/05/2018.

CLINICAL DATA: Intraoperative imaging for right hip replacement.

EXAM:
OPERATIVE RIGHT HIP (WITH PELVIS IF PERFORMED) 3 VIEWS
TECHNIQUE: Fluoroscopic spot image(s) were submitted for interpretation
post-operatively.

[Series 1: unknown protocol · 0.20mm/px · 3 of 3 slices shown]
[im 1/3]
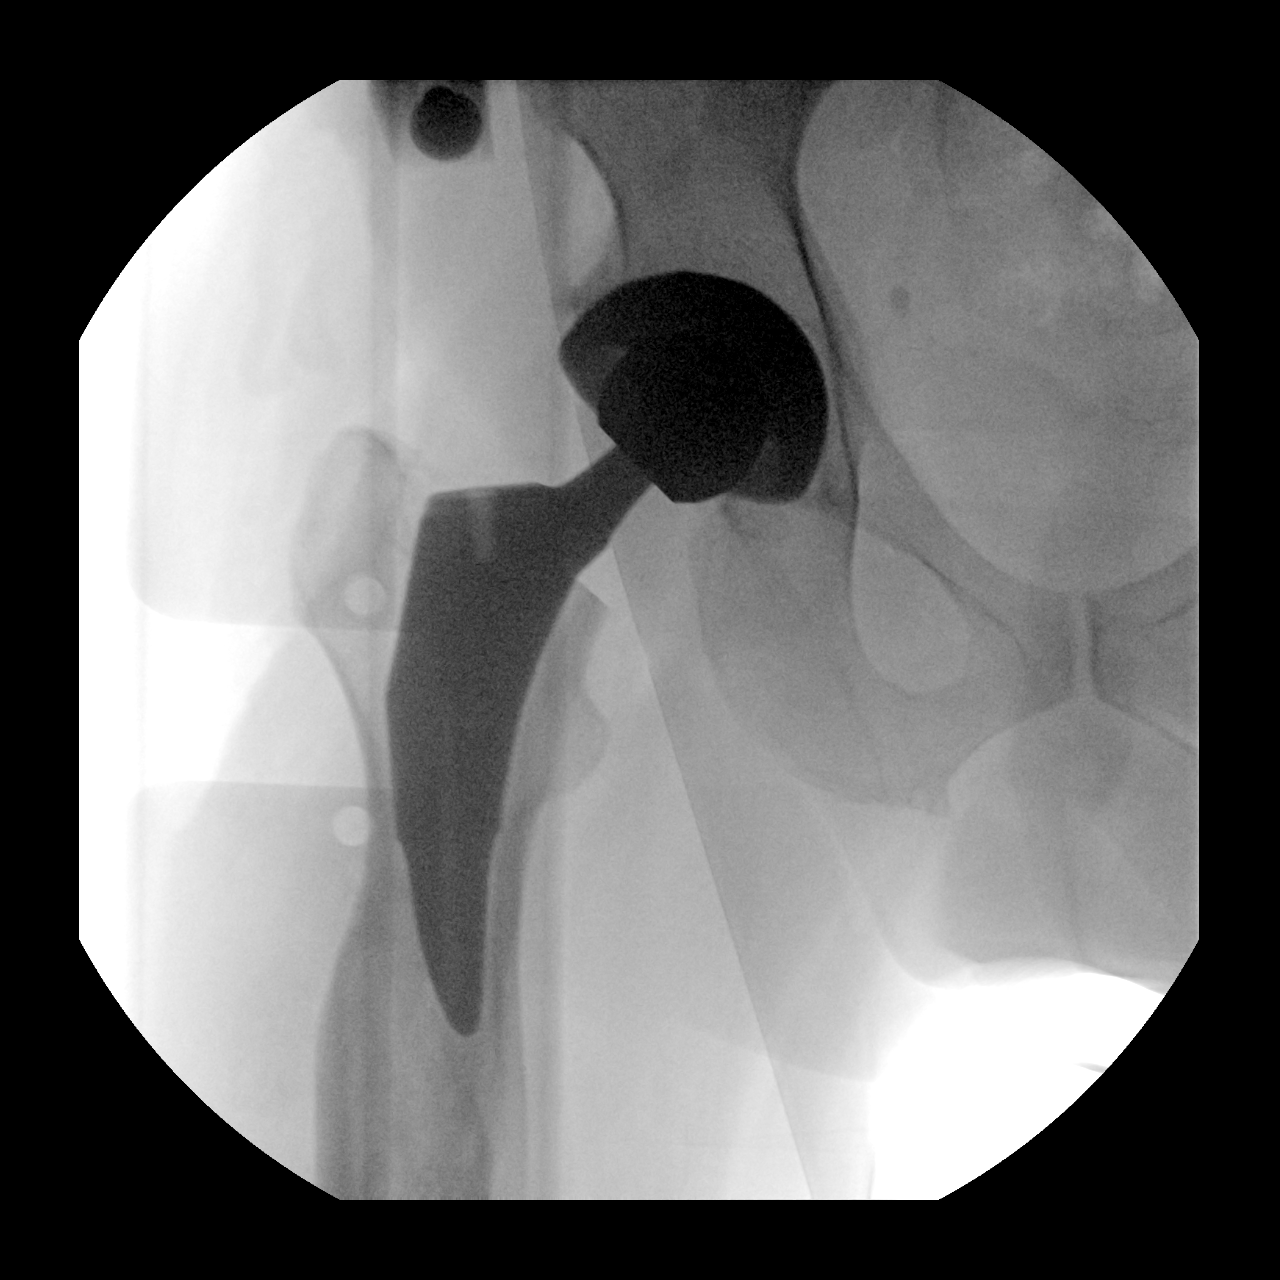
[im 2/3]
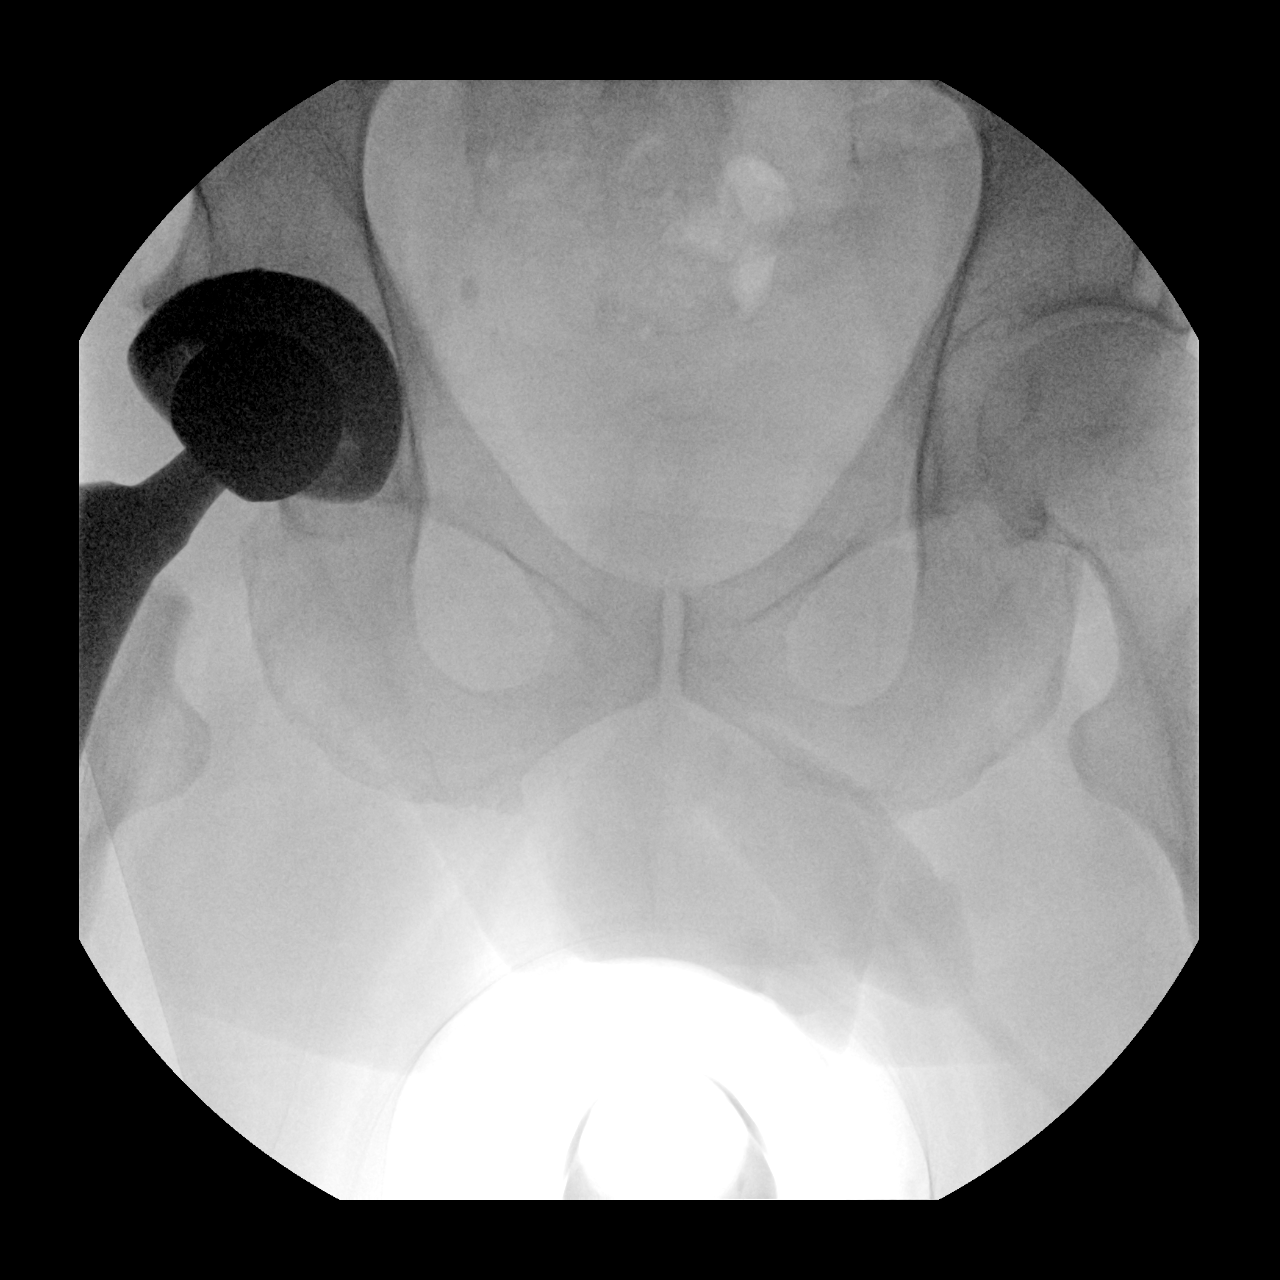
[im 3/3]
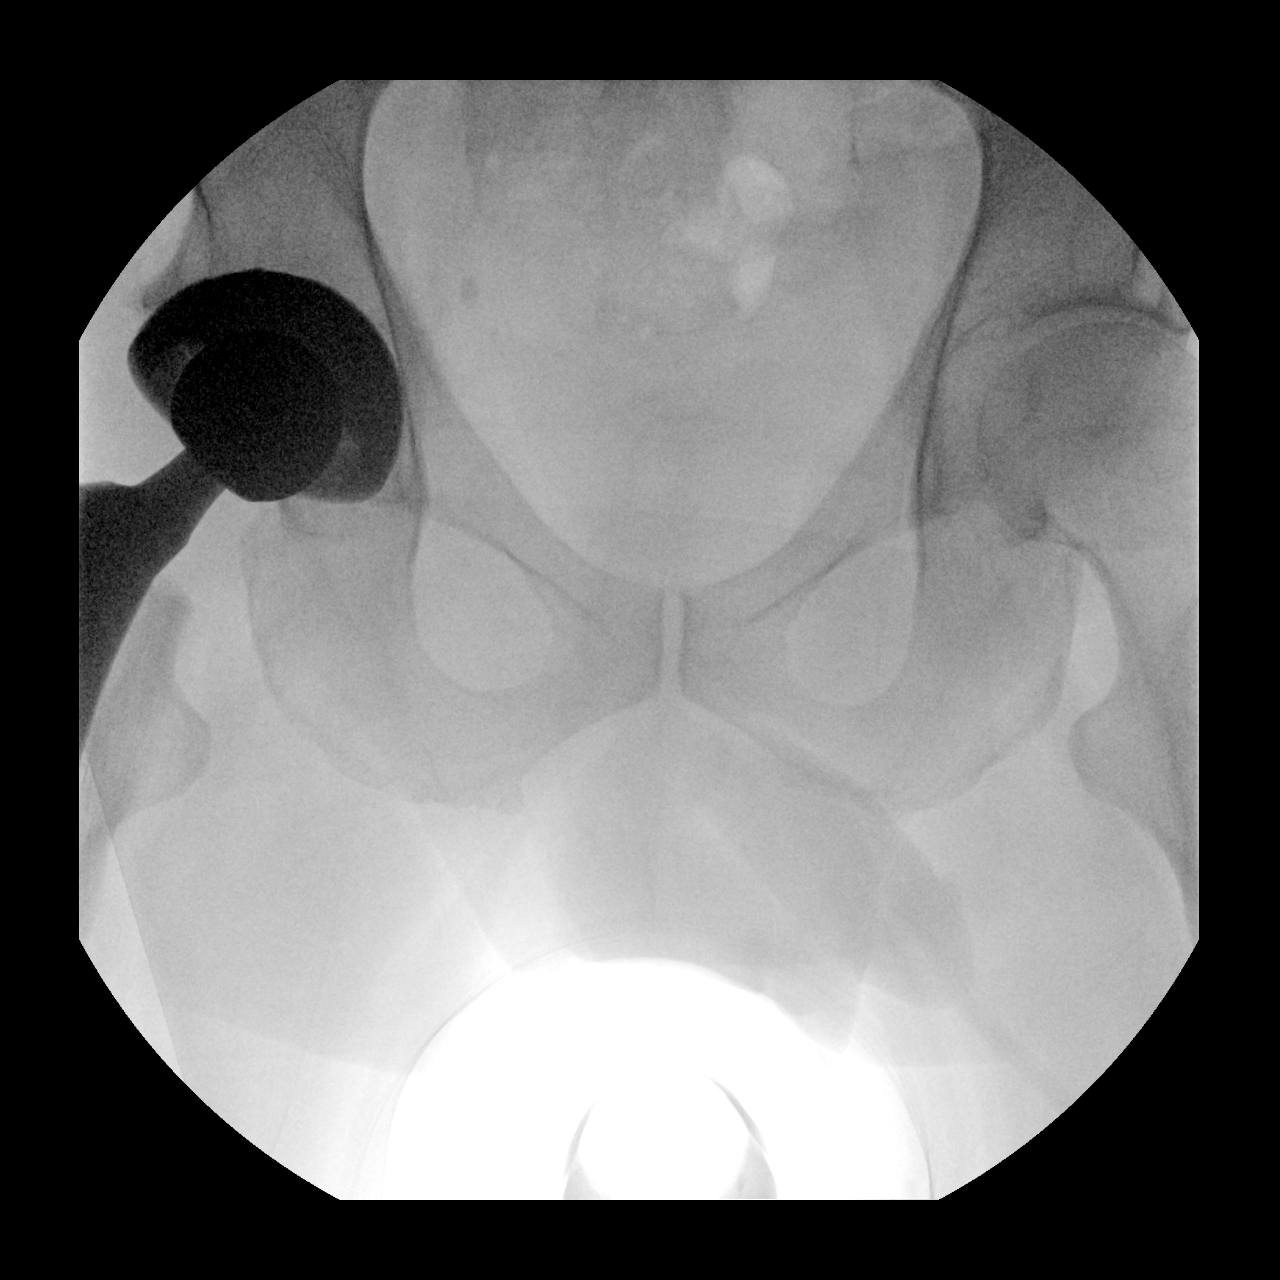

[3 of 3 positions shown; findings below may reference images not displayed]

FINDINGS: Three fluoroscopic spot views of the low pelvis and right hip are
provided. Total right hip replacement is in place. No acute
abnormality.
IMPRESSION: Intraoperative imaging for right hip replacement.
